# Patient Record
Sex: Female | Born: 1935 | Hispanic: No
Health system: Midwestern US, Academic
[De-identification: ages and names within clinical notes are randomized; demographics above are authoritative.]

---

## 2017-07-02 ENCOUNTER — Encounter: Admit: 2017-07-02 | Discharge: 2017-07-02 | Payer: MEDICARE

## 2017-07-02 NOTE — Telephone Encounter
Spoke with pt and she denies all symptoms to include dizziness, syncope,  "soa", and chest pain. States she does not even recall having a fast HR.  States she has been busy with her son who has had back problems and eye problems. Also taking care of Sister-in-law with an emergency.  Reviewed situation with Dr.Hannen who [place].Owens is on vacation.  Pt worked in to see Dr. Barry Dienes on 07/15/17 at 9:15 am. Attempted to call pt to inform her of the app and no answer.LM to call.

## 2017-07-02 NOTE — Telephone Encounter
-----   Message from Connye Burkitt, RN sent at 07/02/2017  1:26 PM CDT -----  Regarding: FW: remote from today      ----- Message -----  From: Gordy Savers  Sent: 07/02/2017   1:20 PM  To: Connye Burkitt, RN  Subject: remote from today                                2 AT/AF episodes, longest 53 seconds. 177 to 206 bpm avg atrial rates. 102 to 110 bpm avg V rates. Egms show AT Possible AFL.     Thanks,    Thurmond Butts

## 2017-07-03 ENCOUNTER — Ambulatory Visit: Admit: 2017-07-02 | Discharge: 2017-07-03 | Payer: MEDICARE

## 2017-07-03 DIAGNOSIS — I472 Ventricular tachycardia: ICD-10-CM

## 2017-07-03 DIAGNOSIS — Z95810 Presence of automatic (implantable) cardiac defibrillator: ICD-10-CM

## 2017-07-03 DIAGNOSIS — I255 Ischemic cardiomyopathy: Principal | ICD-10-CM

## 2017-07-05 ENCOUNTER — Encounter: Admit: 2017-07-05 | Discharge: 2017-07-05 | Payer: MEDICARE

## 2017-07-05 NOTE — Telephone Encounter
Pt returned call to Alona Bene re: OV on 16th and left preferred # for call back. Spoke to pt and confirmed 8/16 at 0915 will work for her. Order entered and sent msg to scheduler. No further needs identified at this time.

## 2017-07-15 ENCOUNTER — Ambulatory Visit: Admit: 2017-07-15 | Discharge: 2017-07-16 | Payer: MEDICARE

## 2017-07-15 ENCOUNTER — Encounter: Admit: 2017-07-15 | Discharge: 2017-07-15 | Payer: MEDICARE

## 2017-07-15 DIAGNOSIS — I10 Essential (primary) hypertension: ICD-10-CM

## 2017-07-15 DIAGNOSIS — E785 Hyperlipidemia, unspecified: Principal | ICD-10-CM

## 2017-07-15 DIAGNOSIS — I2109 ST elevation (STEMI) myocardial infarction involving other coronary artery of anterior wall: ICD-10-CM

## 2017-07-15 DIAGNOSIS — I255 Ischemic cardiomyopathy: ICD-10-CM

## 2017-07-15 DIAGNOSIS — I499 Cardiac arrhythmia, unspecified: ICD-10-CM

## 2017-07-15 DIAGNOSIS — I071 Rheumatic tricuspid insufficiency: ICD-10-CM

## 2017-07-15 DIAGNOSIS — I25119 Atherosclerotic heart disease of native coronary artery with unspecified angina pectoris: ICD-10-CM

## 2017-07-15 DIAGNOSIS — I2510 Atherosclerotic heart disease of native coronary artery without angina pectoris: Principal | ICD-10-CM

## 2017-07-15 DIAGNOSIS — Z95810 Presence of automatic (implantable) cardiac defibrillator: ICD-10-CM

## 2017-07-15 DIAGNOSIS — I5189 Other ill-defined heart diseases: ICD-10-CM

## 2017-07-15 DIAGNOSIS — I340 Nonrheumatic mitral (valve) insufficiency: ICD-10-CM

## 2017-07-15 NOTE — Assessment & Plan Note
Blood pressure looks fine on the current medical program.

## 2017-07-15 NOTE — Assessment & Plan Note
Her last ischemia evaluation was a stress test earlier this year showing a fixed anterior defect but no ischemia.  The ejection fraction was 44%.

## 2017-07-15 NOTE — Progress Notes
Date of Service: 07/15/2017    Grace Wright is a 81 y.o. female.       HPI     Treyonna was in the [place] office today for follow-up regarding coronary disease, ICD, and LV dysfunction.  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  She's been involved providing care for her son, JD, who has had a lot of back trouble.    She has done well from a cardiac standpoint and has had no problems with chest discomfort, breathlessness, or palpitations.  She denies any TIA or stroke symptoms.  She has had no peripheral edema or other manifestations of fluid retention.         Vitals:    07/15/17 0915 07/15/17 0931   BP: 126/82 120/74   Pulse: 78    Weight: 62.4 kg (137 lb 9.6 oz)    Height: 1.549 m (5' 1)      Body mass index is 26 kg/m???.     Past Medical History  Patient Active Problem List    Diagnosis Date Noted   ??? Carotid artery bruit 01/14/2017     02/09/17 Carotid duplex at [facility]: Mild carotid bulb plaque bilaterally extending of the prox bilateral internal carotid arteries. No hemodynamically significant stenosis.     ??? Automatic implantable cardioverter-defibrillator in situ 03/25/2010   ??? Coronary artery disease 09/03/2009     5/86 - Anterior MI.      1987 - RCA and LAD angioplasty.      7/99 - Exercise echo: EF 30%, non-ischemic.      7/00 - Gated bruce thallium: no active inducible ischemia.      7/01 - Exercise echo: non-ischemic.     ??? Ischemic cardiomyopathy 09/03/2009      7/99 - EF 30% per echo.       7/00 - EF 37% per thallium.       7/01 - EF 25% per echo     ??? Hyperlipidemia 09/03/2009     1997- Treatment with Zocor.       7/01 - Switched to Lipitor d/t prescription formulary.       2010 - Switched to Crestor for prescription formulary     ??? Hypertension 09/03/2009   ??? Mitral regurgitation 09/03/2009   ??? Tricuspid regurgitation 09/03/2009   ??? Diastolic dysfunction 09/03/2009     Mild     ??? Acute MI anterior wall first episode care [facility]) 09/04/1986 S/P PTCA LAD, S/P PTCA RCA           Review of Systems   Constitution: Positive for diaphoresis.   HENT: Negative.    Eyes: Negative.    Cardiovascular: Negative.    Respiratory: Negative.    Endocrine: Positive for cold intolerance.   Hematologic/Lymphatic: Negative.    Skin: Negative.    Musculoskeletal: Negative.    Gastrointestinal: Negative.    Genitourinary: Negative.    Neurological: Positive for light-headedness.   Psychiatric/Behavioral: Negative.    Allergic/Immunologic: Positive for environmental allergies.       Physical Exam    Physical Exam   General Appearance: no distress   Skin: warm, no ulcers or xanthomas   Digits and Nails: no cyanosis or clubbing   Eyes: conjunctivae and lids normal, pupils are equal and round   Teeth/Gums/Palate: dentition unremarkable, no lesions   Lips & Oral Mucosa: no pallor or cyanosis   Neck Veins: normal JVP , neck veins are not distended  Thyroid: no nodules, masses, tenderness or enlargement   Chest Inspection: chest is normal in appearance   Respiratory Effort: breathing comfortably, no respiratory distress   Auscultation/Percussion: lungs clear to auscultation, no rales or rhonchi, no wheezing   PMI: PMI not enlarged or displaced   Cardiac Rhythm: regular rhythm and normal rate   Cardiac Auscultation: S1, S2 normal, no rub, no gallop   Murmurs: no murmur   Peripheral Circulation: normal peripheral circulation   Carotid Arteries: normal carotid upstroke bilaterally, no bruits   Radial Arteries: normal symmetric radial pulses   Abdominal Aorta: no abdominal aortic bruit   Pedal Pulses: normal symmetric pedal pulses   Lower Extremity Edema: no lower extremity edema   Abdominal Exam: soft, non-tender, no masses, bowel sounds normal   Liver & Spleen: no organomegaly   Gait & Station: walks without assistance   Muscle Strength: normal muscle tone   Orientation: oriented to time, place and person   Affect & Mood: appropriate and sustained affect Language and Memory: patient responsive and seems to comprehend information   Neurologic Exam: neurological assessment grossly intact   Other: moves all extremities      Cardiovascular Studies    EKG:  Atrial-paced rhythm with LBBB.  Rate 78.    Problems Addressed Today  Encounter Diagnoses   Name Primary?   ??? Hyperlipidemia, unspecified hyperlipidemia type    ??? Ischemic cardiomyopathy    ??? Essential hypertension    ??? Coronary artery disease involving native coronary artery of native heart with angina pectoris (HCC)    ??? Automatic implantable cardioverter-defibrillator in situ        Assessment and Plan       Hyperlipidemia  Lab Results   Component Value Date    CHOL 167 12/25/2016    TRIG 240 (H) 12/25/2016    HDL 31 (L) 12/25/2016    LDL 74 12/25/2016    VLDL 48 (H) 12/25/2016    CHOLHDLC 5 12/25/2016      LDL treated to goal.    Ischemic cardiomyopathy  EF was 44% by RVG earlier this year.  No HF symptoms or physical findings.    Hypertension  Blood pressure looks fine on the current medical program.    Coronary artery disease  Her last ischemia evaluation was a stress test earlier this year showing a fixed anterior defect but no ischemia.  The ejection fraction was 44%.    Automatic implantable cardioverter-defibrillator in situ  Normal device function by remote transmission earlier this month.  She has always had some brief atrial arrhythmias detected but I do not think these are clinically significant.      Current Medications (including today's revisions)  ??? acetaminophen (TYLENOL EXTRA STRENGTH) 500 mg tablet Take 500 mg by mouth Every 6 Hours as needed for Pain.   ??? aspirin EC 81 mg tablet Take 81 mg by mouth Daily.   ??? atorvastatin (LIPITOR) 20 mg tablet Take 1 tablet by mouth daily.   ??? busPIRone (BUSPAR) 5 mg tablet Take 5 mg by mouth Twice Daily.   ??? carvedilol (COREG) 25 mg PO tablet Take 25 mg by mouth Twice Daily With Meals.   ??? latanoprost (XALATAN) 0.005 % ophthalmic solution Place 1 drop into or around eye(s) at bedtime daily.   ??? losartan-hydrochlorothiazide (HYZAAR) 100-12.5 mg per tablet Take 1 Tab by mouth daily.   ??? omeprazole DR(+) (PRILOSEC) 40 mg capsule Take 40 mg by mouth daily before breakfast.

## 2017-07-15 NOTE — Assessment & Plan Note
Normal device function by remote transmission earlier this month.  She has always had some brief atrial arrhythmias detected but I do not think these are clinically significant.

## 2017-07-15 NOTE — Assessment & Plan Note
Lab Results   Component Value Date    CHOL 167 12/25/2016    TRIG 240 (H) 12/25/2016    HDL 31 (L) 12/25/2016    LDL 74 12/25/2016    VLDL 48 (H) 12/25/2016    CHOLHDLC 5 12/25/2016      LDL treated to goal.

## 2017-07-15 NOTE — Assessment & Plan Note
EF was 44% by RVG earlier this year.  No HF symptoms or physical findings.

## 2017-10-01 ENCOUNTER — Ambulatory Visit: Admit: 2017-10-01 | Discharge: 2017-10-02 | Payer: MEDICARE

## 2017-10-02 DIAGNOSIS — Z95810 Presence of automatic (implantable) cardiac defibrillator: ICD-10-CM

## 2017-10-02 DIAGNOSIS — I255 Ischemic cardiomyopathy: Principal | ICD-10-CM

## 2017-11-01 LAB — BASIC METABOLIC PANEL
Lab: 1.3 — ABNORMAL HIGH (ref 0.57–1.11)
Lab: 106
Lab: 107
Lab: 140
Lab: 22 — ABNORMAL LOW (ref 23–31)
Lab: 34 — ABNORMAL HIGH (ref 9.8–20.1)
Lab: 4
Lab: 9.8

## 2017-12-23 ENCOUNTER — Encounter: Admit: 2017-12-23 | Discharge: 2017-12-23 | Payer: MEDICARE

## 2017-12-25 ENCOUNTER — Encounter: Admit: 2017-12-25 | Discharge: 2017-12-25 | Payer: MEDICARE

## 2017-12-25 LAB — COMPREHENSIVE METABOLIC PANEL
Lab: 0.9
Lab: 110
Lab: 137
Lab: 16
Lab: 18 — ABNORMAL HIGH (ref 0–14)
Lab: 33 — ABNORMAL HIGH (ref 9.8–20.1)
Lab: 61
Lab: 8.7 — ABNORMAL HIGH (ref 6.2–8.1)
Lab: 9.6

## 2017-12-25 LAB — CBC: Lab: 6.2

## 2017-12-25 LAB — TROPONIN-I

## 2017-12-25 LAB — MYOGLOBIN-CHEM: Lab: 38 — ABNORMAL LOW (ref 23–31)

## 2017-12-25 LAB — CREATINE KINASE-CPK: Lab: 50

## 2018-01-04 ENCOUNTER — Encounter: Admit: 2018-01-04 | Discharge: 2018-01-04 | Payer: MEDICARE

## 2018-01-06 ENCOUNTER — Encounter: Admit: 2018-01-06 | Discharge: 2018-01-06 | Payer: MEDICARE

## 2018-01-06 ENCOUNTER — Ambulatory Visit: Admit: 2018-01-06 | Discharge: 2018-01-07 | Payer: MEDICARE

## 2018-01-06 DIAGNOSIS — I2109 ST elevation (STEMI) myocardial infarction involving other coronary artery of anterior wall: ICD-10-CM

## 2018-01-06 DIAGNOSIS — Z95810 Presence of automatic (implantable) cardiac defibrillator: Principal | ICD-10-CM

## 2018-01-06 DIAGNOSIS — I255 Ischemic cardiomyopathy: ICD-10-CM

## 2018-01-06 DIAGNOSIS — I25119 Atherosclerotic heart disease of native coronary artery with unspecified angina pectoris: Principal | ICD-10-CM

## 2018-01-06 DIAGNOSIS — I10 Essential (primary) hypertension: ICD-10-CM

## 2018-01-06 DIAGNOSIS — I2510 Atherosclerotic heart disease of native coronary artery without angina pectoris: Principal | ICD-10-CM

## 2018-01-06 DIAGNOSIS — E785 Hyperlipidemia, unspecified: ICD-10-CM

## 2018-01-06 DIAGNOSIS — I340 Nonrheumatic mitral (valve) insufficiency: ICD-10-CM

## 2018-01-06 DIAGNOSIS — I5189 Other ill-defined heart diseases: ICD-10-CM

## 2018-01-06 DIAGNOSIS — I071 Rheumatic tricuspid insufficiency: ICD-10-CM

## 2018-01-06 DIAGNOSIS — I499 Cardiac arrhythmia, unspecified: ICD-10-CM

## 2018-01-06 DIAGNOSIS — R079 Chest pain, unspecified: Principal | ICD-10-CM

## 2018-01-06 LAB — BASIC METABOLIC PANEL
Lab: 104
Lab: 14
Lab: 25
Lab: 27 — ABNORMAL HIGH (ref 9.8–20.1)
Lab: 54
Lab: 9.9
Lab: 93

## 2018-01-06 LAB — CBC
Lab: 13
Lab: 31 — ABNORMAL LOW
Lab: 4.5
Lab: 7.2

## 2018-01-06 MED ORDER — ACETAMINOPHEN 325 MG PO TAB
650 mg | ORAL | 0 refills | Status: CN | PRN
Start: 2018-01-06 — End: ?

## 2018-01-06 MED ORDER — SODIUM CHLORIDE 0.9 % IV SOLP
250 mL | INTRAVENOUS | 0 refills | Status: CN | PRN
Start: 2018-01-06 — End: ?

## 2018-01-06 MED ORDER — ASPIRIN 325 MG PO TAB
325 mg | Freq: Once | ORAL | 0 refills | Status: CN
Start: 2018-01-06 — End: ?

## 2018-01-06 MED ORDER — SODIUM CHLORIDE 0.9 % IV SOLP
INTRAVENOUS | 0 refills | Status: CN
Start: 2018-01-06 — End: ?

## 2018-01-06 MED ORDER — LIDOCAINE (PF) 10 MG/ML (1 %) IJ SOLN
.1-2 mL | INTRAMUSCULAR | 0 refills | Status: CN | PRN
Start: 2018-01-06 — End: ?

## 2018-01-06 MED ORDER — ALUMINUM-MAGNESIUM HYDROXIDE 200-200 MG/5 ML PO SUSP
30 mL | ORAL | 0 refills | Status: CN | PRN
Start: 2018-01-06 — End: ?

## 2018-01-06 MED ORDER — TEMAZEPAM 15 MG PO CAP
15 mg | Freq: Every evening | ORAL | 0 refills | Status: CN | PRN
Start: 2018-01-06 — End: ?

## 2018-01-07 ENCOUNTER — Encounter: Admit: 2018-01-07 | Discharge: 2018-01-07 | Payer: MEDICARE

## 2018-01-07 ENCOUNTER — Encounter: Admit: 2018-01-07 | Discharge: 2018-01-08 | Payer: MEDICARE

## 2018-01-07 ENCOUNTER — Ambulatory Visit: Admit: 2018-01-07 | Discharge: 2018-01-07 | Payer: MEDICARE

## 2018-01-07 MED ORDER — CARVEDILOL 25 MG PO TAB
25 mg | Freq: Two times a day (BID) | ORAL | 0 refills | Status: DC
Start: 2018-01-07 — End: 2018-01-08
  Administered 2018-01-07 – 2018-01-08 (×2): 25 mg via ORAL

## 2018-01-07 MED ORDER — ALUMINUM-MAGNESIUM HYDROXIDE 200-200 MG/5 ML PO SUSP
30 mL | ORAL | 0 refills | Status: DC | PRN
Start: 2018-01-07 — End: 2018-01-08

## 2018-01-07 MED ORDER — DIPHENHYDRAMINE HCL 25 MG PO CAP
25 mg | ORAL | 0 refills | Status: DC | PRN
Start: 2018-01-07 — End: 2018-01-08

## 2018-01-07 MED ORDER — ASPIRIN 81 MG PO TBEC
81 mg | Freq: Every day | ORAL | 0 refills | Status: DC
Start: 2018-01-07 — End: 2018-01-08
  Administered 2018-01-08: 14:00:00 81 mg via ORAL

## 2018-01-07 MED ORDER — FAMOTIDINE 20 MG PO TAB
40 mg | Freq: Every day | ORAL | 0 refills | Status: DC
Start: 2018-01-07 — End: 2018-01-08
  Administered 2018-01-08: 14:00:00 40 mg via ORAL

## 2018-01-07 MED ORDER — ATORVASTATIN 20 MG PO TAB
20 mg | Freq: Every day | ORAL | 0 refills | Status: DC
Start: 2018-01-07 — End: 2018-01-07

## 2018-01-07 MED ORDER — LIDOCAINE (PF) 10 MG/ML (1 %) IJ SOLN
.1-2 mL | INTRAMUSCULAR | 0 refills | Status: DC | PRN
Start: 2018-01-07 — End: 2018-01-08

## 2018-01-07 MED ORDER — SODIUM CHLORIDE 0.9 % IV SOLP
INTRAVENOUS | 0 refills | Status: DC
Start: 2018-01-07 — End: 2018-01-08
  Administered 2018-01-07: 17:00:00 1000.000 mL via INTRAVENOUS

## 2018-01-07 MED ORDER — ASPIRIN 325 MG PO TAB
325 mg | Freq: Once | ORAL | 0 refills | Status: DC
Start: 2018-01-07 — End: 2018-01-07

## 2018-01-07 MED ORDER — ATORVASTATIN 40 MG PO TAB
40 mg | Freq: Every evening | ORAL | 0 refills | Status: DC
Start: 2018-01-07 — End: 2018-01-08
  Administered 2018-01-08: 03:00:00 40 mg via ORAL

## 2018-01-07 MED ORDER — TEMAZEPAM 15 MG PO CAP
15 mg | Freq: Every evening | ORAL | 0 refills | Status: DC | PRN
Start: 2018-01-07 — End: 2018-01-08

## 2018-01-07 MED ORDER — ACETAMINOPHEN 500 MG PO TAB
500 mg | ORAL | 0 refills | Status: DC | PRN
Start: 2018-01-07 — End: 2018-01-07

## 2018-01-07 MED ORDER — CLOPIDOGREL 75 MG PO TAB
75 mg | Freq: Every day | ORAL | 0 refills | Status: DC
Start: 2018-01-07 — End: 2018-01-08
  Administered 2018-01-08: 14:00:00 75 mg via ORAL

## 2018-01-07 MED ORDER — ONDANSETRON HCL (PF) 4 MG/2 ML IJ SOLN
4 mg | INTRAVENOUS | 0 refills | Status: DC | PRN
Start: 2018-01-07 — End: 2018-01-08

## 2018-01-07 MED ORDER — BUSPIRONE 5 MG PO TAB
5 mg | Freq: Two times a day (BID) | ORAL | 0 refills | Status: DC
Start: 2018-01-07 — End: 2018-01-08
  Administered 2018-01-08 (×2): 5 mg via ORAL

## 2018-01-07 MED ORDER — LATANOPROST 0.005 % OP DROP
1 [drp] | Freq: Every evening | OPHTHALMIC | 0 refills | Status: DC
Start: 2018-01-07 — End: 2018-01-08
  Administered 2018-01-08: 03:00:00 1 [drp] via OPHTHALMIC

## 2018-01-07 MED ORDER — CLOPIDOGREL 300 MG PO TAB
600 mg | Freq: Once | ORAL | 0 refills | Status: CP
Start: 2018-01-07 — End: ?

## 2018-01-07 MED ORDER — LOSARTAN-HYDROCHLOROTHIAZIDE 100-12.5 MG TAB
Freq: Every day | ORAL | 0 refills | Status: DC
Start: 2018-01-07 — End: 2018-01-08
  Administered 2018-01-07 – 2018-01-08 (×4): via ORAL

## 2018-01-07 MED ORDER — DIPHENHYDRAMINE HCL 50 MG/ML IJ SOLN
25 mg | INTRAVENOUS | 0 refills | Status: DC | PRN
Start: 2018-01-07 — End: 2018-01-08

## 2018-01-07 MED ORDER — ACETAMINOPHEN 325 MG PO TAB
650 mg | ORAL | 0 refills | Status: DC | PRN
Start: 2018-01-07 — End: 2018-01-08
  Administered 2018-01-08: 02:00:00 650 mg via ORAL

## 2018-01-07 MED ORDER — ADENOSINE IVPB
.56 mg/kg | Freq: Once | INTRAVENOUS | 0 refills | Status: CP
Start: 2018-01-07 — End: ?

## 2018-01-07 MED ORDER — ASPIRIN 81 MG PO TBEC
81 mg | Freq: Every day | ORAL | 0 refills | Status: DC
Start: 2018-01-07 — End: 2018-01-07

## 2018-01-07 MED ORDER — SODIUM CHLORIDE 0.9 % IV SOLP
250 mL | INTRAVENOUS | 0 refills | Status: DC | PRN
Start: 2018-01-07 — End: 2018-01-08

## 2018-01-08 DIAGNOSIS — R61 Generalized hyperhidrosis: ICD-10-CM

## 2018-01-08 DIAGNOSIS — I2510 Atherosclerotic heart disease of native coronary artery without angina pectoris: Principal | ICD-10-CM

## 2018-01-08 DIAGNOSIS — I255 Ischemic cardiomyopathy: Secondary | ICD-10-CM

## 2018-01-08 DIAGNOSIS — R42 Dizziness and giddiness: ICD-10-CM

## 2018-01-08 LAB — BASIC METABOLIC PANEL
Lab: 137 MMOL/L (ref 137–147)
Lab: 4 MMOL/L — ABNORMAL HIGH (ref 3.5–5.1)
Lab: 9 K/UL — ABNORMAL LOW (ref >60)

## 2018-01-08 LAB — LIPID PROFILE
Lab: 103 mg/dL — ABNORMAL HIGH (ref 65–99)
Lab: 132 mg/dL (ref <200)
Lab: 153 mg/dL — ABNORMAL HIGH (ref <150)
Lab: 29 mg/dL — ABNORMAL LOW (ref >40)
Lab: 31 mg/dL — ABNORMAL HIGH (ref 9.0–11.5)
Lab: 69 mg/dL — ABNORMAL HIGH (ref <100)

## 2018-01-08 MED ORDER — CLOPIDOGREL 75 MG PO TAB
75 mg | ORAL_TABLET | Freq: Every day | ORAL | 3 refills | 90.00000 days | Status: AC
Start: 2018-01-08 — End: 2018-10-25

## 2018-01-08 MED ORDER — ATORVASTATIN 40 MG PO TAB
40 mg | ORAL_TABLET | Freq: Every evening | ORAL | 3 refills | Status: AC
Start: 2018-01-08 — End: 2019-01-16

## 2018-01-10 ENCOUNTER — Encounter: Admit: 2018-01-10 | Discharge: 2018-01-10 | Payer: MEDICARE

## 2018-01-10 LAB — POC ACTIVATED CLOTTING TIME
Lab: 225 s
Lab: 320 s
Lab: 316 s

## 2018-01-19 ENCOUNTER — Ambulatory Visit: Admit: 2018-01-19 | Discharge: 2018-01-20 | Payer: MEDICARE

## 2018-01-20 DIAGNOSIS — I255 Ischemic cardiomyopathy: Principal | ICD-10-CM

## 2018-01-20 DIAGNOSIS — Z95810 Presence of automatic (implantable) cardiac defibrillator: Secondary | ICD-10-CM

## 2018-02-03 ENCOUNTER — Encounter: Admit: 2018-02-03 | Discharge: 2018-02-03 | Payer: MEDICARE

## 2018-02-03 ENCOUNTER — Ambulatory Visit: Admit: 2018-02-03 | Discharge: 2018-02-04 | Payer: MEDICARE

## 2018-02-03 DIAGNOSIS — Z95810 Presence of automatic (implantable) cardiac defibrillator: Principal | ICD-10-CM

## 2018-02-03 DIAGNOSIS — I071 Rheumatic tricuspid insufficiency: ICD-10-CM

## 2018-02-03 DIAGNOSIS — I10 Essential (primary) hypertension: ICD-10-CM

## 2018-02-03 DIAGNOSIS — I255 Ischemic cardiomyopathy: ICD-10-CM

## 2018-02-03 DIAGNOSIS — I2510 Atherosclerotic heart disease of native coronary artery without angina pectoris: Principal | ICD-10-CM

## 2018-02-03 DIAGNOSIS — E785 Hyperlipidemia, unspecified: ICD-10-CM

## 2018-02-03 DIAGNOSIS — I2109 ST elevation (STEMI) myocardial infarction involving other coronary artery of anterior wall: ICD-10-CM

## 2018-02-03 DIAGNOSIS — I5189 Other ill-defined heart diseases: ICD-10-CM

## 2018-02-03 DIAGNOSIS — I340 Nonrheumatic mitral (valve) insufficiency: ICD-10-CM

## 2018-02-03 DIAGNOSIS — I499 Cardiac arrhythmia, unspecified: ICD-10-CM

## 2018-02-03 MED ORDER — HYDROCHLOROTHIAZIDE 12.5 MG PO TAB
12.5 mg | ORAL_TABLET | Freq: Every morning | ORAL | 3 refills | 30.00000 days | Status: AC
Start: 2018-02-03 — End: 2019-01-17

## 2018-03-03 ENCOUNTER — Encounter: Admit: 2018-03-03 | Discharge: 2018-03-03 | Payer: MEDICARE

## 2018-03-03 DIAGNOSIS — I499 Cardiac arrhythmia, unspecified: ICD-10-CM

## 2018-03-03 DIAGNOSIS — I5189 Other ill-defined heart diseases: ICD-10-CM

## 2018-03-03 DIAGNOSIS — I255 Ischemic cardiomyopathy: ICD-10-CM

## 2018-03-03 DIAGNOSIS — I340 Nonrheumatic mitral (valve) insufficiency: ICD-10-CM

## 2018-03-03 DIAGNOSIS — I10 Essential (primary) hypertension: ICD-10-CM

## 2018-03-03 DIAGNOSIS — I2109 ST elevation (STEMI) myocardial infarction involving other coronary artery of anterior wall: ICD-10-CM

## 2018-03-03 DIAGNOSIS — E785 Hyperlipidemia, unspecified: ICD-10-CM

## 2018-03-03 DIAGNOSIS — I2510 Atherosclerotic heart disease of native coronary artery without angina pectoris: Principal | ICD-10-CM

## 2018-03-03 DIAGNOSIS — I071 Rheumatic tricuspid insufficiency: ICD-10-CM

## 2018-03-18 ENCOUNTER — Encounter: Admit: 2018-03-18 | Discharge: 2018-03-18 | Payer: MEDICARE

## 2018-03-21 ENCOUNTER — Encounter: Admit: 2018-03-21 | Discharge: 2018-03-21 | Payer: MEDICARE

## 2018-03-21 DIAGNOSIS — I255 Ischemic cardiomyopathy: Principal | ICD-10-CM

## 2018-03-21 DIAGNOSIS — Z4502 Encounter for adjustment and management of automatic implantable cardiac defibrillator: ICD-10-CM

## 2018-03-21 MED ORDER — CEFAZOLIN INJ 1GM IVP
1 g | INTRAVENOUS | 0 refills | Status: CN
Start: 2018-03-21 — End: ?

## 2018-03-21 MED ORDER — LIDOCAINE (PF) 10 MG/ML (1 %) IJ SOLN
.1-2 mL | INTRAMUSCULAR | 0 refills | Status: CN | PRN
Start: 2018-03-21 — End: ?

## 2018-03-21 MED ORDER — CEFAZOLIN INJ 1GM IVP
2 g | Freq: Once | INTRAVENOUS | 0 refills | Status: CN
Start: 2018-03-21 — End: ?

## 2018-03-22 ENCOUNTER — Encounter: Admit: 2018-03-22 | Discharge: 2018-03-22 | Payer: MEDICARE

## 2018-03-24 ENCOUNTER — Encounter: Admit: 2018-03-24 | Discharge: 2018-03-24 | Payer: MEDICARE

## 2018-03-24 ENCOUNTER — Ambulatory Visit: Admit: 2018-03-24 | Discharge: 2018-03-25 | Payer: MEDICARE

## 2018-03-24 DIAGNOSIS — I2109 ST elevation (STEMI) myocardial infarction involving other coronary artery of anterior wall: ICD-10-CM

## 2018-03-24 DIAGNOSIS — I499 Cardiac arrhythmia, unspecified: ICD-10-CM

## 2018-03-24 DIAGNOSIS — I10 Essential (primary) hypertension: ICD-10-CM

## 2018-03-24 DIAGNOSIS — E785 Hyperlipidemia, unspecified: ICD-10-CM

## 2018-03-24 DIAGNOSIS — I255 Ischemic cardiomyopathy: Principal | ICD-10-CM

## 2018-03-24 DIAGNOSIS — I2510 Atherosclerotic heart disease of native coronary artery without angina pectoris: Principal | ICD-10-CM

## 2018-03-24 DIAGNOSIS — E782 Mixed hyperlipidemia: ICD-10-CM

## 2018-03-24 DIAGNOSIS — Z95810 Presence of automatic (implantable) cardiac defibrillator: ICD-10-CM

## 2018-03-24 DIAGNOSIS — I340 Nonrheumatic mitral (valve) insufficiency: ICD-10-CM

## 2018-03-24 DIAGNOSIS — I071 Rheumatic tricuspid insufficiency: ICD-10-CM

## 2018-03-24 DIAGNOSIS — Z01810 Encounter for preprocedural cardiovascular examination: ICD-10-CM

## 2018-03-24 DIAGNOSIS — I5189 Other ill-defined heart diseases: ICD-10-CM

## 2018-03-24 LAB — CBC
Lab: 13 pg — ABNORMAL HIGH (ref 27.0–33.0)
Lab: 13 {cells}/uL (ref 200–950)
Lab: 235 {cells}/uL (ref 1500–7800)
Lab: 30 Thousand/uL — ABNORMAL HIGH (ref 140–400)
Lab: 31 fL — ABNORMAL LOW (ref 33.0–37.0)
Lab: 4.5 fL — ABNORMAL HIGH (ref 80.0–100.0)
Lab: 43 g/dL (ref 32.0–36.0)
Lab: 6.6 % (ref 35.0–45.0)
Lab: 95 % (ref 11.0–15.0)

## 2018-03-24 LAB — BASIC METABOLIC PANEL
Lab: 0.8
Lab: 102
Lab: 109 — ABNORMAL HIGH (ref 98–107)
Lab: 13
Lab: 142
Lab: 142 g/dL (ref 11.7–15.5)
Lab: 18
Lab: 3.9
Lab: 70
Lab: 9.5

## 2018-03-24 LAB — MAGNESIUM
Lab: 1.9
Lab: 1.9 10*6/uL (ref 3.80–5.10)

## 2018-03-28 ENCOUNTER — Encounter: Admit: 2018-03-28 | Discharge: 2018-03-28 | Payer: MEDICARE

## 2018-03-28 MED ORDER — MAGNESIUM HYDROXIDE 2,400 MG/10 ML PO SUSP
10 mL | ORAL | 0 refills | Status: CN | PRN
Start: 2018-03-28 — End: ?

## 2018-03-28 MED ORDER — ACETAMINOPHEN 325 MG PO TAB
650 mg | ORAL | 0 refills | Status: CN | PRN
Start: 2018-03-28 — End: ?

## 2018-03-31 ENCOUNTER — Encounter: Admit: 2018-03-31 | Discharge: 2018-03-31 | Payer: MEDICARE

## 2018-03-31 ENCOUNTER — Ambulatory Visit: Admit: 2018-03-31 | Discharge: 2018-03-31 | Payer: MEDICARE

## 2018-03-31 DIAGNOSIS — I255 Ischemic cardiomyopathy: Principal | ICD-10-CM

## 2018-03-31 DIAGNOSIS — I10 Essential (primary) hypertension: ICD-10-CM

## 2018-03-31 DIAGNOSIS — I071 Rheumatic tricuspid insufficiency: ICD-10-CM

## 2018-03-31 DIAGNOSIS — I2109 ST elevation (STEMI) myocardial infarction involving other coronary artery of anterior wall: ICD-10-CM

## 2018-03-31 DIAGNOSIS — I2510 Atherosclerotic heart disease of native coronary artery without angina pectoris: Principal | ICD-10-CM

## 2018-03-31 DIAGNOSIS — I340 Nonrheumatic mitral (valve) insufficiency: ICD-10-CM

## 2018-03-31 DIAGNOSIS — E785 Hyperlipidemia, unspecified: ICD-10-CM

## 2018-03-31 DIAGNOSIS — I5189 Other ill-defined heart diseases: ICD-10-CM

## 2018-03-31 DIAGNOSIS — Z4502 Encounter for adjustment and management of automatic implantable cardiac defibrillator: ICD-10-CM

## 2018-03-31 DIAGNOSIS — I499 Cardiac arrhythmia, unspecified: ICD-10-CM

## 2018-03-31 MED ORDER — HYDROCODONE-ACETAMINOPHEN 5-325 MG PO TAB
1 | ORAL | 0 refills | Status: DC | PRN
Start: 2018-03-31 — End: 2018-03-31

## 2018-03-31 MED ORDER — HYDROCODONE-ACETAMINOPHEN 5-300 MG PO TAB
1 | ORAL_TABLET | ORAL | 0 refills | 30.00000 days | Status: AC | PRN
Start: 2018-03-31 — End: 2019-04-13

## 2018-03-31 MED ORDER — CEPHALEXIN 500 MG PO CAP
500 mg | ORAL_CAPSULE | Freq: Three times a day (TID) | ORAL | 0 refills | Status: AC
Start: 2018-03-31 — End: ?

## 2018-03-31 MED ORDER — SODIUM CHLORIDE 0.9 % IV SOLP
500 mL | INTRAVENOUS | 0 refills | Status: DC
Start: 2018-03-31 — End: 2018-03-31
  Administered 2018-03-31: 12:00:00 500 mL via INTRAVENOUS

## 2018-03-31 MED ORDER — CEFAZOLIN INJ 1GM IVP
1 g | INTRAVENOUS | 0 refills | Status: DC
Start: 2018-03-31 — End: 2018-03-31

## 2018-03-31 MED ORDER — ACETAMINOPHEN 325 MG PO TAB
325-650 mg | ORAL | 0 refills | Status: DC | PRN
Start: 2018-03-31 — End: 2018-03-31

## 2018-03-31 MED ORDER — ACETAMINOPHEN 325 MG PO TAB
650 mg | ORAL | 0 refills | Status: DC | PRN
Start: 2018-03-31 — End: 2018-03-31

## 2018-03-31 MED ORDER — LIDOCAINE (PF) 10 MG/ML (1 %) IJ SOLN
.1-2 mL | INTRAMUSCULAR | 0 refills | Status: DC | PRN
Start: 2018-03-31 — End: 2018-03-31

## 2018-03-31 MED ORDER — MAGNESIUM HYDROXIDE 2,400 MG/10 ML PO SUSP
10 mL | ORAL | 0 refills | Status: DC | PRN
Start: 2018-03-31 — End: 2018-03-31

## 2018-03-31 MED ORDER — CEFAZOLIN INJ 1GM IVP
2 g | Freq: Once | INTRAVENOUS | 0 refills | Status: CP
Start: 2018-03-31 — End: ?

## 2018-04-01 ENCOUNTER — Encounter: Admit: 2018-04-01 | Discharge: 2018-04-01 | Payer: MEDICARE

## 2018-04-20 ENCOUNTER — Ambulatory Visit: Admit: 2018-04-20 | Discharge: 2018-04-21 | Payer: MEDICARE

## 2018-04-20 DIAGNOSIS — Z95810 Presence of automatic (implantable) cardiac defibrillator: Principal | ICD-10-CM

## 2018-04-21 DIAGNOSIS — I255 Ischemic cardiomyopathy: ICD-10-CM

## 2018-07-11 LAB — CBC
Lab: 13
Lab: 41
Lab: 6.5

## 2018-07-11 LAB — COMPREHENSIVE METABOLIC PANEL
Lab: 144
Lab: 28

## 2018-07-20 ENCOUNTER — Encounter: Admit: 2018-07-20 | Discharge: 2018-07-20 | Payer: MEDICARE

## 2018-07-21 ENCOUNTER — Ambulatory Visit: Admit: 2018-07-20 | Discharge: 2018-07-21 | Payer: MEDICARE

## 2018-07-21 DIAGNOSIS — Z95810 Presence of automatic (implantable) cardiac defibrillator: ICD-10-CM

## 2018-07-21 DIAGNOSIS — I255 Ischemic cardiomyopathy: Principal | ICD-10-CM

## 2018-08-31 ENCOUNTER — Encounter: Admit: 2018-08-31 | Discharge: 2018-08-31 | Payer: MEDICARE

## 2018-10-11 ENCOUNTER — Encounter: Admit: 2018-10-11 | Discharge: 2018-10-11 | Payer: MEDICARE

## 2018-10-11 DIAGNOSIS — I255 Ischemic cardiomyopathy: Principal | ICD-10-CM

## 2018-10-11 DIAGNOSIS — I10 Essential (primary) hypertension: ICD-10-CM

## 2018-10-11 DIAGNOSIS — I2510 Atherosclerotic heart disease of native coronary artery without angina pectoris: ICD-10-CM

## 2018-10-19 ENCOUNTER — Ambulatory Visit: Admit: 2018-10-19 | Discharge: 2018-10-19 | Payer: MEDICARE

## 2018-10-19 DIAGNOSIS — I428 Other cardiomyopathies: ICD-10-CM

## 2018-10-19 DIAGNOSIS — Z95810 Presence of automatic (implantable) cardiac defibrillator: ICD-10-CM

## 2018-10-19 DIAGNOSIS — I255 Ischemic cardiomyopathy: Principal | ICD-10-CM

## 2018-10-25 ENCOUNTER — Ambulatory Visit: Admit: 2018-10-25 | Discharge: 2018-10-26 | Payer: MEDICARE

## 2018-10-25 ENCOUNTER — Encounter: Admit: 2018-10-25 | Discharge: 2018-10-25 | Payer: MEDICARE

## 2018-10-25 DIAGNOSIS — I10 Essential (primary) hypertension: ICD-10-CM

## 2018-10-25 DIAGNOSIS — I2510 Atherosclerotic heart disease of native coronary artery without angina pectoris: Principal | ICD-10-CM

## 2018-10-25 DIAGNOSIS — E785 Hyperlipidemia, unspecified: ICD-10-CM

## 2018-10-25 DIAGNOSIS — I2109 ST elevation (STEMI) myocardial infarction involving other coronary artery of anterior wall: ICD-10-CM

## 2018-10-25 DIAGNOSIS — I5189 Other ill-defined heart diseases: ICD-10-CM

## 2018-10-25 DIAGNOSIS — I255 Ischemic cardiomyopathy: ICD-10-CM

## 2018-10-25 DIAGNOSIS — I340 Nonrheumatic mitral (valve) insufficiency: ICD-10-CM

## 2018-10-25 DIAGNOSIS — I071 Rheumatic tricuspid insufficiency: ICD-10-CM

## 2018-10-25 DIAGNOSIS — E782 Mixed hyperlipidemia: ICD-10-CM

## 2018-10-25 DIAGNOSIS — I499 Cardiac arrhythmia, unspecified: ICD-10-CM

## 2018-10-25 MED ORDER — CLOPIDOGREL 75 MG PO TAB
75 mg | ORAL_TABLET | Freq: Every day | ORAL | 3 refills | 90.00000 days | Status: AC
Start: 2018-10-25 — End: 2018-12-20

## 2018-12-20 ENCOUNTER — Encounter: Admit: 2018-12-20 | Discharge: 2018-12-20 | Payer: MEDICARE

## 2018-12-20 DIAGNOSIS — I2510 Atherosclerotic heart disease of native coronary artery without angina pectoris: Secondary | ICD-10-CM

## 2018-12-20 MED ORDER — CLOPIDOGREL 75 MG PO TAB
ORAL_TABLET | Freq: Every day | ORAL | 3 refills | 90.00000 days | Status: AC
Start: 2018-12-20 — End: 2019-01-19

## 2018-12-27 LAB — COMPREHENSIVE METABOLIC PANEL
Lab: 0.7
Lab: 1.1 mL/min — ABNORMAL HIGH (ref 0.57–1.11)
Lab: 104 MMOL/L (ref 21–30)
Lab: 11
Lab: 115 mL/min — ABNORMAL HIGH (ref 70–105)
Lab: 138 U/L (ref 25–110)
Lab: 14
Lab: 16
Lab: 24 U/L (ref 7–56)
Lab: 3.7
Lab: 30 10*3/uL — ABNORMAL HIGH (ref 9.8–20.1)
Lab: 4.3 U/L (ref 7–40)
Lab: 48 — ABNORMAL LOW (ref >59)
Lab: 7.5 10*3/uL (ref 0–0.20)
Lab: 76
Lab: 9.5 10*3/uL (ref 0–0.45)

## 2018-12-30 LAB — BASIC METABOLIC PANEL
Lab: 107
Lab: 13
Lab: 138
Lab: 3.7
Lab: 75

## 2019-01-03 ENCOUNTER — Encounter: Admit: 2019-01-03 | Discharge: 2019-01-03 | Payer: MEDICARE

## 2019-01-10 ENCOUNTER — Ambulatory Visit: Admit: 2019-01-10 | Discharge: 2019-01-11 | Payer: MEDICARE

## 2019-01-10 DIAGNOSIS — Z95810 Presence of automatic (implantable) cardiac defibrillator: Principal | ICD-10-CM

## 2019-01-16 ENCOUNTER — Encounter: Admit: 2019-01-16 | Discharge: 2019-01-16 | Payer: MEDICARE

## 2019-01-16 DIAGNOSIS — I2510 Atherosclerotic heart disease of native coronary artery without angina pectoris: Principal | ICD-10-CM

## 2019-01-16 MED ORDER — ATORVASTATIN 40 MG PO TAB
ORAL_TABLET | 2 refills | Status: AC
Start: 2019-01-16 — End: 2020-01-01

## 2019-01-17 ENCOUNTER — Encounter: Admit: 2019-01-17 | Discharge: 2019-01-17 | Payer: MEDICARE

## 2019-01-17 MED ORDER — HYDROCHLOROTHIAZIDE 12.5 MG PO TAB
12.5 mg | ORAL_TABLET | Freq: Every morning | ORAL | 1 refills | 30.00000 days | Status: AC
Start: 2019-01-17 — End: 2019-07-18

## 2019-01-19 ENCOUNTER — Ambulatory Visit: Admit: 2019-01-19 | Discharge: 2019-01-19 | Payer: MEDICARE

## 2019-01-19 ENCOUNTER — Encounter: Admit: 2019-01-19 | Discharge: 2019-01-19 | Payer: MEDICARE

## 2019-01-19 DIAGNOSIS — Z95810 Presence of automatic (implantable) cardiac defibrillator: ICD-10-CM

## 2019-01-19 DIAGNOSIS — I255 Ischemic cardiomyopathy: Principal | ICD-10-CM

## 2019-04-12 ENCOUNTER — Encounter: Admit: 2019-04-12 | Discharge: 2019-04-12 | Payer: MEDICARE

## 2019-04-13 ENCOUNTER — Ambulatory Visit: Admit: 2019-04-13 | Discharge: 2019-04-14 | Payer: MEDICARE

## 2019-04-13 ENCOUNTER — Encounter: Admit: 2019-04-13 | Discharge: 2019-04-13 | Payer: MEDICARE

## 2019-04-13 DIAGNOSIS — E785 Hyperlipidemia, unspecified: ICD-10-CM

## 2019-04-13 DIAGNOSIS — I255 Ischemic cardiomyopathy: Principal | ICD-10-CM

## 2019-04-13 DIAGNOSIS — I5189 Other ill-defined heart diseases: ICD-10-CM

## 2019-04-13 DIAGNOSIS — I2109 ST elevation (STEMI) myocardial infarction involving other coronary artery of anterior wall: ICD-10-CM

## 2019-04-13 DIAGNOSIS — I2510 Atherosclerotic heart disease of native coronary artery without angina pectoris: Principal | ICD-10-CM

## 2019-04-13 DIAGNOSIS — I499 Cardiac arrhythmia, unspecified: ICD-10-CM

## 2019-04-13 DIAGNOSIS — I340 Nonrheumatic mitral (valve) insufficiency: ICD-10-CM

## 2019-04-13 DIAGNOSIS — I071 Rheumatic tricuspid insufficiency: ICD-10-CM

## 2019-04-13 DIAGNOSIS — I10 Essential (primary) hypertension: ICD-10-CM

## 2019-04-13 DIAGNOSIS — E782 Mixed hyperlipidemia: ICD-10-CM

## 2019-04-13 NOTE — Assessment & Plan Note
Class I heart failure symptoms.  Stable volume status.

## 2019-04-13 NOTE — Progress Notes
Date of Service: 04/13/2019    Grace Wright is a 83 y.o. female.       HPI     Marielys was in the [place] office today for follow-up regarding ischemic cardiomyopathy and ICD.  She was hospitalized in January for an episode of pneumonia but had no cardiac complications.  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    She pretty much feels like she is back to normal now and she is having no problems with palpitations, lightheadedness, or breathlessness.  She denies any chest discomfort.  She has had no TIA or stroke symptoms.         Vitals:    04/13/19 1245 04/13/19 1251   BP: 130/86 128/80   BP Source: Arm, Left Upper Arm, Right Upper   Pulse: 78    Temp: 36.8 ???C (98.3 ???F)    SpO2: 98%    Weight: 58.5 kg (129 lb)    Height: 1.575 m (5' 2)    PainSc: Zero      Body mass index is 23.59 kg/m???.     Past Medical History  Patient Active Problem List    Diagnosis Date Noted   ??? Preop cardiovascular exam 03/24/2018   ??? Carotid artery bruit 01/14/2017     02/09/17 Carotid duplex at [facility]: Mild carotid bulb plaque bilaterally extending of the prox bilateral internal carotid arteries. No hemodynamically significant stenosis.     ??? Automatic implantable cardioverter-defibrillator in situ 03/25/2010   ??? Coronary artery disease 09/03/2009     5/86 - Anterior MI.      1987 - RCA and LAD angioplasty.      7/99 - Exercise echo: EF 30%, non-ischemic.      7/00 - Gated bruce thallium: no active inducible ischemia.      7/01 - Exercise echo: non-ischemic.  01/07/2018 cath - DES x 1 to LAD      ??? Ischemic cardiomyopathy 09/03/2009      7/99 - EF 30% per echo.       7/00 - EF 37% per thallium.       7/01 - EF 25% per echo     ??? Hyperlipidemia 09/03/2009     1997- Treatment with Zocor.       7/01 - Switched to Lipitor d/t prescription formulary.       2010 - Switched to Crestor for prescription formulary     ??? Hypertension 09/03/2009

## 2019-04-20 ENCOUNTER — Ambulatory Visit: Admit: 2019-04-20 | Discharge: 2019-04-21 | Payer: MEDICARE

## 2019-04-21 DIAGNOSIS — I255 Ischemic cardiomyopathy: Principal | ICD-10-CM

## 2019-04-21 DIAGNOSIS — Z95810 Presence of automatic (implantable) cardiac defibrillator: ICD-10-CM

## 2019-04-26 ENCOUNTER — Encounter: Admit: 2019-04-26 | Discharge: 2019-04-26 | Payer: MEDICARE

## 2019-04-26 DIAGNOSIS — Z95810 Presence of automatic (implantable) cardiac defibrillator: Principal | ICD-10-CM

## 2019-05-04 ENCOUNTER — Encounter: Admit: 2019-05-04 | Discharge: 2019-05-04

## 2019-05-04 DIAGNOSIS — E782 Mixed hyperlipidemia: Secondary | ICD-10-CM

## 2019-05-04 LAB — LIPID PROFILE
Lab: 147
Lab: 175 — ABNORMAL HIGH (ref <150)
Lab: 35
Lab: 80

## 2019-05-04 NOTE — Telephone Encounter
-----   Message from Vanice Sarah, MD sent at 05/04/2019 10:50 AM CDT -----  Looks OK, I wouldn't change anything at this point.    Cc:  Dr. Suzanna Obey

## 2019-05-04 NOTE — Telephone Encounter
Results and recommendations called to patient lmom requested call back if questions

## 2019-05-04 NOTE — Telephone Encounter
-----   Message from Steven D Owens, MD sent at 05/04/2019 10:50 AM CDT -----  Looks OK, I wouldn't change anything at this point.    Cc:  Dr. Groth

## 2019-07-18 ENCOUNTER — Encounter: Admit: 2019-07-18 | Discharge: 2019-07-18 | Payer: MEDICARE

## 2019-07-18 MED ORDER — HYDROCHLOROTHIAZIDE 12.5 MG PO TAB
12.5 mg | ORAL_TABLET | Freq: Every morning | ORAL | 3 refills | 30.00000 days | Status: DC
Start: 2019-07-18 — End: 2020-07-15

## 2019-07-21 ENCOUNTER — Encounter: Admit: 2019-07-21 | Discharge: 2019-07-21

## 2019-07-21 DIAGNOSIS — I472 Ventricular tachycardia: Principal | ICD-10-CM

## 2019-07-21 DIAGNOSIS — I255 Ischemic cardiomyopathy: Secondary | ICD-10-CM

## 2019-07-22 ENCOUNTER — Ambulatory Visit: Admit: 2019-07-21 | Discharge: 2019-07-22

## 2019-07-22 DIAGNOSIS — Z95810 Presence of automatic (implantable) cardiac defibrillator: Secondary | ICD-10-CM

## 2019-12-07 ENCOUNTER — Encounter: Admit: 2019-12-07 | Discharge: 2019-12-07 | Payer: MEDICARE

## 2019-12-14 ENCOUNTER — Encounter: Admit: 2019-12-14 | Discharge: 2019-12-14 | Payer: MEDICARE | Primary: Family

## 2019-12-14 DIAGNOSIS — I2510 Atherosclerotic heart disease of native coronary artery without angina pectoris: Secondary | ICD-10-CM

## 2019-12-14 DIAGNOSIS — I5189 Other ill-defined heart diseases: Secondary | ICD-10-CM

## 2019-12-14 DIAGNOSIS — I10 Essential (primary) hypertension: Secondary | ICD-10-CM

## 2019-12-14 DIAGNOSIS — I071 Rheumatic tricuspid insufficiency: Secondary | ICD-10-CM

## 2019-12-14 DIAGNOSIS — I2109 ST elevation (STEMI) myocardial infarction involving other coronary artery of anterior wall: Secondary | ICD-10-CM

## 2019-12-14 DIAGNOSIS — I255 Ischemic cardiomyopathy: Secondary | ICD-10-CM

## 2019-12-14 DIAGNOSIS — Z95810 Presence of automatic (implantable) cardiac defibrillator: Secondary | ICD-10-CM

## 2019-12-14 DIAGNOSIS — E782 Mixed hyperlipidemia: Secondary | ICD-10-CM

## 2019-12-14 DIAGNOSIS — E785 Hyperlipidemia, unspecified: Secondary | ICD-10-CM

## 2019-12-14 DIAGNOSIS — I340 Nonrheumatic mitral (valve) insufficiency: Secondary | ICD-10-CM

## 2019-12-14 DIAGNOSIS — I499 Cardiac arrhythmia, unspecified: Secondary | ICD-10-CM

## 2019-12-14 NOTE — Progress Notes
Date of Service: 12/14/2019    Grace Wright is a 84 y.o. female.       HPI     Grace Wright was in the [place] clinic today for follow-up regarding heart failure, coronary disease, and ICD.  She has been home and completely retired for the past year.  She does a lot of cooking still for family and friends and seems to be getting along just fine.  She denies any problems with chest discomfort or breathlessness.  She has had no fluid retention she denies any palpitations or lightheadedness.         Vitals:    12/14/19 1013 12/14/19 1022   BP: 132/80 128/80   BP Source: Arm, Left Upper Arm, Right Upper   Patient Position: Sitting Sitting   Pulse: 74    Temp: 36.6 ?C (97.9 ?F)    TempSrc: Oral    SpO2: 97%    Weight: 59 kg (130 lb)    Height: 1.575 m (5' 2)    PainSc: Zero      Body mass index is 23.78 kg/m?Marland Kitchen     Past Medical History  Patient Active Problem List    Diagnosis Date Noted   ? Preop cardiovascular exam 03/24/2018   ? Carotid artery bruit 01/14/2017     02/09/17 Carotid duplex at [facility]: Mild carotid bulb plaque bilaterally extending of the prox bilateral internal carotid arteries. No hemodynamically significant stenosis.     ? Automatic implantable cardioverter-defibrillator in situ 03/25/2010   ? Coronary artery disease 09/03/2009     5/86 - Anterior MI.      1987 - RCA and LAD angioplasty.      7/99 - Exercise echo: EF 30%, non-ischemic.      7/00 - Gated bruce thallium: no active inducible ischemia.      7/01 - Exercise echo: non-ischemic.  01/07/2018 cath - DES x 1 to LAD      ? Ischemic cardiomyopathy 09/03/2009      7/99 - EF 30% per echo.       7/00 - EF 37% per thallium.       7/01 - EF 25% per echo     ? Hyperlipidemia 09/03/2009     1997- Treatment with Zocor.       7/01 - Switched to Lipitor d/t prescription formulary.       2010 - Switched to Crestor for prescription formulary     ? Hypertension 09/03/2009   ? Mitral regurgitation 09/03/2009   ? Tricuspid regurgitation 09/03/2009 ? Diastolic dysfunction 09/03/2009     Mild     ? Acute MI anterior wall first episode care [facility]) 09/04/1986     S/P PTCA LAD, S/P PTCA RCA           Review of Systems   Constitution: Negative.   HENT: Negative.    Eyes: Negative.    Cardiovascular: Positive for leg swelling (left).   Respiratory: Negative.    Endocrine: Negative.    Hematologic/Lymphatic: Negative.    Skin: Negative.    Musculoskeletal: Negative.    Gastrointestinal: Negative.    Genitourinary: Negative.    Neurological: Negative.    Psychiatric/Behavioral: Negative.    Allergic/Immunologic: Negative.        Physical Exam    Physical Exam   General Appearance: no distress   Skin: warm, no ulcers or xanthomas   Digits and Nails: no cyanosis or clubbing   Eyes: conjunctivae and lids normal, pupils are equal and  round   Teeth/Gums/Palate: dentition unremarkable, no lesions   Lips & Oral Mucosa: no pallor or cyanosis   Neck Veins: normal JVP , neck veins are not distended   Thyroid: no nodules, masses, tenderness or enlargement   Chest Inspection: chest is normal in appearance   Respiratory Effort: breathing comfortably, no respiratory distress   Auscultation/Percussion: lungs clear to auscultation, no rales or rhonchi, no wheezing   PMI: PMI not enlarged or displaced   Cardiac Rhythm: regular rhythm and normal rate   Cardiac Auscultation: S1, S2 normal, no rub, no gallop   Murmurs: no murmur   Peripheral Circulation: normal peripheral circulation   Carotid Arteries: normal carotid upstroke bilaterally, no bruits   Radial Arteries: normal symmetric radial pulses   Abdominal Aorta: no abdominal aortic bruit   Pedal Pulses: normal symmetric pedal pulses   Lower Extremity Edema: no lower extremity edema   Abdominal Exam: soft, non-tender, no masses, bowel sounds normal   Liver & Spleen: no organomegaly   Gait & Station: walks without assistance   Muscle Strength: normal muscle tone   Orientation: oriented to time, place and person Affect & Mood: appropriate and sustained affect   Language and Memory: patient responsive and seems to comprehend information   Neurologic Exam: neurological assessment grossly intact   Other: moves all extremities      Problems Addressed Today  Encounter Diagnoses   Name Primary?   ? Coronary artery disease involving native coronary artery of native heart without angina pectoris    ? Ischemic cardiomyopathy    ? Mixed hyperlipidemia    ? Essential hypertension    ? Automatic implantable cardioverter-defibrillator in situ        Assessment and Plan       Coronary artery disease  No angina or other symptoms to suggest progression of coronary disease.  She had ACS presentation in early 2019 and underwent LAD stent at that time.  She has no ischemia assessment since then.    Ischemic cardiomyopathy  Her ejection fraction is stable in the range of 25%.  She does not have any manifestations of heart failure currently.    Hyperlipidemia  Lab Results   Component Value Date    CHOL 141 11/17/2019    TRIG 211 (H) 11/17/2019    HDL 34 (L) 11/17/2019    LDL 65 11/17/2019    VLDL 35 05/03/2019    NONHDLCHOL 103 01/08/2018    CHOLHDLC 4 11/17/2019      LDL treated to goal.    Hypertension  Blood pressure looks fine on the current medical program.    Automatic implantable cardioverter-defibrillator in situ  She had a normal remote transmission in late November.      Current Medications (including today's revisions)  ? acetaminophen (TYLENOL EXTRA STRENGTH) 500 mg tablet Take 500 mg by mouth Every 6 Hours as needed for Pain.   ? aspirin EC 81 mg tablet Take 81 mg by mouth Daily.   ? atorvastatin (LIPITOR) 40 mg tablet 1T PO QHS   ? busPIRone (BUSPAR) 15 mg tablet Take 0.5 tablets by mouth twice daily.   ? carvedilol (COREG) 25 mg PO tablet Take 25 mg by mouth Twice Daily With Meals.   ? hydroCHLOROthiazide (HYDRODIURIL) 12.5 mg tablet Take one tablet by mouth every morning. ? latanoprost (XALATAN) 0.005 % ophthalmic solution Place 1 drop into or around eye(s) at bedtime daily.   ? nitroglycerin (NITROSTAT) 0.4 mg tablet Place 1 tablet under tongue every 5  minutes as needed.   ? pantoprazole DR (PROTONIX) 40 mg tablet Take 1 tablet by mouth twice daily.   ? traZODone (DESYREL) 50 mg tablet Take 1 tablet by mouth at bedtime daily.     Total time spent on today's office visit was 30 minutes.  This includes face-to-face in person visit with patient as well as nonface-to-face time including review of the EMR, outside records, labs, radiologic studies, echocardiogram & other cardiovascular studies, formation of treatment plan, after visit summary, future disposition, and lastly on documentation.

## 2019-12-14 NOTE — Assessment & Plan Note
No angina or other symptoms to suggest progression of coronary disease.  She had ACS presentation in early 2019 and underwent LAD stent at that time.  She has no ischemia assessment since then.

## 2019-12-14 NOTE — Assessment & Plan Note
Lab Results   Component Value Date    CHOL 141 11/17/2019    TRIG 211 (H) 11/17/2019    HDL 34 (L) 11/17/2019    LDL 65 11/17/2019    VLDL 35 05/03/2019    NONHDLCHOL 103 01/08/2018    CHOLHDLC 4 11/17/2019      LDL treated to goal.

## 2019-12-14 NOTE — Assessment & Plan Note
She had a normal remote transmission in late November.

## 2019-12-14 NOTE — Assessment & Plan Note
Her ejection fraction is stable in the range of 25%.  She does not have any manifestations of heart failure currently.

## 2019-12-14 NOTE — Assessment & Plan Note
Blood pressure looks fine on the current medical program.

## 2020-01-01 ENCOUNTER — Encounter: Admit: 2020-01-01 | Discharge: 2020-01-01 | Payer: MEDICARE | Primary: Family

## 2020-01-01 DIAGNOSIS — I2510 Atherosclerotic heart disease of native coronary artery without angina pectoris: Secondary | ICD-10-CM

## 2020-01-01 MED ORDER — ATORVASTATIN 40 MG PO TAB
ORAL_TABLET | Freq: Every evening | 3 refills | Status: AC
Start: 2020-01-01 — End: ?

## 2020-01-23 ENCOUNTER — Encounter: Admit: 2020-01-23 | Discharge: 2020-01-23 | Payer: MEDICARE | Primary: Family

## 2020-01-23 ENCOUNTER — Ambulatory Visit: Admit: 2020-01-23 | Discharge: 2020-01-23 | Payer: MEDICARE | Primary: Family

## 2020-01-23 DIAGNOSIS — Z95810 Presence of automatic (implantable) cardiac defibrillator: Secondary | ICD-10-CM

## 2020-03-07 ENCOUNTER — Encounter: Admit: 2020-03-07 | Discharge: 2020-03-07 | Payer: MEDICARE | Primary: Family

## 2020-06-18 ENCOUNTER — Encounter: Admit: 2020-06-18 | Discharge: 2020-06-18 | Payer: MEDICARE | Primary: Family

## 2020-06-18 NOTE — Telephone Encounter
Patient was scheduled for a [employer] Carelink on 05/29/20 that has not been received.     Patient was instructed to send a manual transmission. Instructed if the transmitter does not appear to be working properly, they need to contact the device company directly. Patient was provided with that contact number. Requested the patient send [name] a MyChart message or contact our device nurses at [phone] to let us know after they have sent their transmission. Called Preferred telephone number spoke with Willie. Patient verbalized understanding.    CDJ

## 2020-07-07 ENCOUNTER — Encounter: Admit: 2020-07-07 | Discharge: 2020-07-07 | Payer: MEDICARE | Primary: Family

## 2020-07-09 ENCOUNTER — Encounter: Admit: 2020-07-09 | Discharge: 2020-07-09 | Payer: MEDICARE | Primary: Family

## 2020-07-09 DIAGNOSIS — I2510 Atherosclerotic heart disease of native coronary artery without angina pectoris: Secondary | ICD-10-CM

## 2020-07-09 DIAGNOSIS — E782 Mixed hyperlipidemia: Secondary | ICD-10-CM

## 2020-07-09 DIAGNOSIS — I5189 Other ill-defined heart diseases: Secondary | ICD-10-CM

## 2020-07-09 DIAGNOSIS — Z95810 Presence of automatic (implantable) cardiac defibrillator: Secondary | ICD-10-CM

## 2020-07-09 DIAGNOSIS — E785 Hyperlipidemia, unspecified: Secondary | ICD-10-CM

## 2020-07-09 DIAGNOSIS — I10 Essential (primary) hypertension: Secondary | ICD-10-CM

## 2020-07-09 DIAGNOSIS — I340 Nonrheumatic mitral (valve) insufficiency: Secondary | ICD-10-CM

## 2020-07-09 DIAGNOSIS — I499 Cardiac arrhythmia, unspecified: Secondary | ICD-10-CM

## 2020-07-09 DIAGNOSIS — I255 Ischemic cardiomyopathy: Secondary | ICD-10-CM

## 2020-07-09 DIAGNOSIS — I071 Rheumatic tricuspid insufficiency: Secondary | ICD-10-CM

## 2020-07-09 DIAGNOSIS — I2109 ST elevation (STEMI) myocardial infarction involving other coronary artery of anterior wall: Secondary | ICD-10-CM

## 2020-07-09 NOTE — Assessment & Plan Note
She had a device check a few weeks ago.  Her device functions normally.  She does have runs of nonsustained VT but none of these are symptomatic and she is required no treatments from her device.

## 2020-07-09 NOTE — Assessment & Plan Note
Goal BP is 130/80 or less, average.  She'll borrow her son's BP monitor and check a few times.  We'll call her in a week or so.

## 2020-07-09 NOTE — Assessment & Plan Note
Lab Results   Component Value Date    CHOL 141 11/17/2019    TRIG 211 (H) 11/17/2019    HDL 34 (L) 11/17/2019    LDL 65 11/17/2019    VLDL 35 05/03/2019    NONHDLCHOL 103 01/08/2018    CHOLHDLC 4 11/17/2019      LDL treated to goal.

## 2020-07-09 NOTE — Assessment & Plan Note
Her last coronary intervention was in February, 2019.  She has no symptoms and I have not recommended surveillance stress testing at this point.

## 2020-07-09 NOTE — Patient Instructions
Borrow JD's BP monitor.  Goal is 130/80 or less, on average  We'll call in a week or 10 days

## 2020-07-09 NOTE — Progress Notes
Date of Service: 07/09/2020    Grace Wright is a 84 y.o. female.       HPI     Grace Wright was in the [place] clinic today for follow-up regarding coronary disease, heart failure, and ICD.  She is done well over the summer months and has been going regularly to a friend's private swimming pool for exercise.  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  She is good with the decision and says that while she misses the people she used to work with she can always go over and visit with them from time to time.    She does not have a blood pressure monitor.  She thinks that her blood pressure was a little elevated last time she was in to see her primary physician.  She will borrow 1 from her son and will touch base with her in about 10 days.    She is not having any palpitations or lightheadedness.  She has had no chest discomfort, breathlessness, or fluid retention.  She denies any TIA or stroke symptoms.         Vitals:    07/09/20 1428 07/09/20 1437   BP: (!) 152/76 (!) 150/76   BP Source: Arm, Left Upper Arm, Right Upper   Patient Position: Sitting Sitting   Pulse: 80    SpO2: 97%    Weight: 59.6 kg (131 lb 6.4 oz)    Height: 1.575 m (5' 2)    PainSc: Zero      Body mass index is 24.03 kg/m?Marland Kitchen     Past Medical History  Patient Active Problem List    Diagnosis Date Noted   ? Preop cardiovascular exam 03/24/2018   ? Carotid artery bruit 01/14/2017     02/09/17 Carotid duplex at [facility]: Mild carotid bulb plaque bilaterally extending of the prox bilateral internal carotid arteries. No hemodynamically significant stenosis.     ? Automatic implantable cardioverter-defibrillator in situ 03/25/2010   ? Coronary artery disease 09/03/2009     5/86 - Anterior MI.      1987 - RCA and LAD angioplasty.      7/99 - Exercise echo: EF 30%, non-ischemic.      7/00 - Gated bruce thallium: no active inducible ischemia.      7/01 - Exercise echo: non-ischemic.  01/07/2018 cath - DES x 1 to LAD      ? Ischemic cardiomyopathy 09/03/2009      7/99 - EF 30% per echo.       7/00 - EF 37% per thallium.       7/01 - EF 25% per echo     ? Hyperlipidemia 09/03/2009     1997- Treatment with Zocor.       7/01 - Switched to Lipitor d/t prescription formulary.       2010 - Switched to Crestor for prescription formulary     ? Hypertension 09/03/2009   ? Mitral regurgitation 09/03/2009   ? Tricuspid regurgitation 09/03/2009   ? Diastolic dysfunction 09/03/2009     Mild     ? Acute MI anterior wall first episode care [facility]) 09/04/1986     S/P PTCA LAD, S/P PTCA RCA           Review of Systems   Constitution: Negative.   HENT: Negative.    Eyes: Negative.    Cardiovascular: Positive for leg swelling.   Respiratory: Negative.  Endocrine: Negative.    Hematologic/Lymphatic: Negative.    Skin: Negative.    Musculoskeletal: Positive for arthritis and joint pain.   Gastrointestinal: Negative.    Genitourinary: Positive for frequency.   Neurological: Negative.    Psychiatric/Behavioral: Negative.    Allergic/Immunologic: Negative.        Physical Exam    Physical Exam   General Appearance: no distress   Skin: warm, no ulcers or xanthomas   Digits and Nails: no cyanosis or clubbing   Eyes: conjunctivae and lids normal, pupils are equal and round   Teeth/Gums/Palate: dentition unremarkable, no lesions   Lips & Oral Mucosa: no pallor or cyanosis   Neck Veins: normal JVP , neck veins are not distended   Thyroid: no nodules, masses, tenderness or enlargement   Chest Inspection: chest is normal in appearance   Respiratory Effort: breathing comfortably, no respiratory distress   Auscultation/Percussion: lungs clear to auscultation, no rales or rhonchi, no wheezing   PMI: PMI not enlarged or displaced   Cardiac Rhythm: regular rhythm and normal rate   Cardiac Auscultation: S1, S2 normal, no rub, no gallop   Murmurs: no murmur   Peripheral Circulation: normal peripheral circulation   Carotid Arteries: normal carotid upstroke bilaterally, no bruits   Radial Arteries: normal symmetric radial pulses   Abdominal Aorta: no abdominal aortic bruit   Pedal Pulses: normal symmetric pedal pulses   Lower Extremity Edema: no lower extremity edema   Abdominal Exam: soft, non-tender, no masses, bowel sounds normal   Liver & Spleen: no organomegaly   Gait & Station: walks without assistance   Muscle Strength: normal muscle tone   Orientation: oriented to time, place and person   Affect & Mood: appropriate and sustained affect   Language and Memory: patient responsive and seems to comprehend information   Neurologic Exam: neurological assessment grossly intact   Other: moves all extremities      Problems Addressed Today  Encounter Diagnoses   Name Primary?   ? Essential hypertension    ? Mixed hyperlipidemia    ? Coronary artery disease involving native coronary artery of native heart without angina pectoris    ? Automatic implantable cardioverter-defibrillator in situ        Assessment and Plan       Hypertension  Goal BP is 130/80 or less, average.  She'll borrow her son's BP monitor and check a few times.  We'll call her in a week or so.    Hyperlipidemia  Lab Results   Component Value Date    CHOL 141 11/17/2019    TRIG 211 (H) 11/17/2019    HDL 34 (L) 11/17/2019    LDL 65 11/17/2019    VLDL 35 05/03/2019    NONHDLCHOL 103 01/08/2018    CHOLHDLC 4 11/17/2019      LDL treated to goal.    Coronary artery disease  Her last coronary intervention was in February, 2019.  She has no symptoms and I have not recommended surveillance stress testing at this point.    Automatic implantable cardioverter-defibrillator in situ  She had a device check a few weeks ago.  Her device functions normally.  She does have runs of nonsustained VT but none of these are symptomatic and she is required no treatments from her device.      Current Medications (including today's revisions)  ? acetaminophen (TYLENOL EXTRA STRENGTH) 500 mg tablet Take 500 mg by mouth Every 6 Hours as needed for Pain.   ? aspirin  EC 81 mg tablet Take 81 mg by mouth Daily.   ? atorvastatin (LIPITOR) 40 mg tablet TAKE 1 TABLET BY MOUTH EVERY NIGHT AT BEDTIME   ? busPIRone (BUSPAR) 15 mg tablet Take 0.5 tablets by mouth twice daily.   ? carvedilol (COREG) 25 mg PO tablet Take 25 mg by mouth Twice Daily With Meals.   ? hydroCHLOROthiazide (HYDRODIURIL) 12.5 mg tablet Take one tablet by mouth every morning.   ? latanoprost (XALATAN) 0.005 % ophthalmic solution Place 1 drop into or around eye(s) at bedtime daily.   ? pantoprazole DR (PROTONIX) 40 mg tablet Take 1 tablet by mouth twice daily.   ? traZODone (DESYREL) 50 mg tablet Take 1 tablet by mouth at bedtime daily.     Total time spent on today's office visit was 30 minutes.  This includes face-to-face in person visit with patient as well as nonface-to-face time including review of the EMR, outside records, labs, radiologic studies, echocardiogram & other cardiovascular studies, formation of treatment plan, after visit summary, future disposition, and lastly on documentation.

## 2020-07-13 ENCOUNTER — Encounter: Admit: 2020-07-13 | Discharge: 2020-07-13 | Payer: MEDICARE | Primary: Family

## 2020-07-13 MED ORDER — HYDROCHLOROTHIAZIDE 12.5 MG PO TAB
12.5 mg | ORAL_TABLET | Freq: Every morning | ORAL | 3 refills
Start: 2020-07-13 — End: ?

## 2020-10-04 ENCOUNTER — Encounter: Admit: 2020-10-04 | Discharge: 2020-10-04 | Payer: MEDICARE | Primary: Family

## 2020-12-29 ENCOUNTER — Encounter: Admit: 2020-12-29 | Discharge: 2020-12-29 | Payer: MEDICARE | Primary: Family

## 2020-12-29 DIAGNOSIS — I2510 Atherosclerotic heart disease of native coronary artery without angina pectoris: Secondary | ICD-10-CM

## 2020-12-29 MED ORDER — ATORVASTATIN 40 MG PO TAB
ORAL_TABLET | Freq: Every evening | 3 refills
Start: 2020-12-29 — End: ?

## 2021-01-05 ENCOUNTER — Encounter: Admit: 2021-01-05 | Discharge: 2021-01-05 | Payer: MEDICARE | Primary: Family

## 2021-01-09 ENCOUNTER — Encounter

## 2021-01-09 DIAGNOSIS — I255 Ischemic cardiomyopathy: Secondary | ICD-10-CM

## 2021-01-09 DIAGNOSIS — Z95810 Presence of automatic (implantable) cardiac defibrillator: Secondary | ICD-10-CM

## 2021-03-12 ENCOUNTER — Encounter: Admit: 2021-03-12 | Discharge: 2021-03-12 | Payer: MEDICARE | Primary: Family

## 2021-03-13 ENCOUNTER — Encounter: Admit: 2021-03-13 | Discharge: 2021-03-13 | Payer: MEDICARE | Primary: Family

## 2021-03-13 DIAGNOSIS — I10 Essential (primary) hypertension: Secondary | ICD-10-CM

## 2021-03-13 DIAGNOSIS — I499 Cardiac arrhythmia, unspecified: Secondary | ICD-10-CM

## 2021-03-13 DIAGNOSIS — E782 Mixed hyperlipidemia: Secondary | ICD-10-CM

## 2021-03-13 DIAGNOSIS — I255 Ischemic cardiomyopathy: Secondary | ICD-10-CM

## 2021-03-13 DIAGNOSIS — I5189 Other ill-defined heart diseases: Secondary | ICD-10-CM

## 2021-03-13 DIAGNOSIS — I2109 ST elevation (STEMI) myocardial infarction involving other coronary artery of anterior wall: Secondary | ICD-10-CM

## 2021-03-13 DIAGNOSIS — I2510 Atherosclerotic heart disease of native coronary artery without angina pectoris: Secondary | ICD-10-CM

## 2021-03-13 DIAGNOSIS — E785 Hyperlipidemia, unspecified: Secondary | ICD-10-CM

## 2021-03-13 DIAGNOSIS — I071 Rheumatic tricuspid insufficiency: Secondary | ICD-10-CM

## 2021-03-13 DIAGNOSIS — I340 Nonrheumatic mitral (valve) insufficiency: Secondary | ICD-10-CM

## 2021-03-13 MED ORDER — LOSARTAN 25 MG PO TAB
25 mg | ORAL_TABLET | Freq: Every day | ORAL | 11 refills | 30.00000 days | Status: AC
Start: 2021-03-13 — End: ?

## 2021-03-13 NOTE — Progress Notes
Date of Service: 03/13/2021    Grace Wright is a 85 y.o. female.       HPI     Grace Wright was in the [place] clinic today for follow-up regarding coronary disease, ICD, and ischemic cardiomyopathy.  She is remained completely retired but she stays very active socially and gets out of the house every day.  She is basically asymptomatic but occasionally she will have times when her breathing is a little harder than others.    She monitors her blood pressure and she has been a little surprised that its been higher lately.  When we go back and look at what her blood pressure has done in the clinic it was pretty good a year ago but last August it was starting to creep upward and today it was definitely higher than it should be.    She had times in the past when her blood pressure was too low.  We had her on ARB's as recently as 2019 but we stopped the losartan at that point because her blood pressure had become a little bit on the low side and she was symptomatic.         Vitals:    03/13/21 1510 03/13/21 1520   BP: (!) 158/92 (!) 156/88   BP Source: Arm, Right Upper Arm, Left Upper   Pulse: 79    SpO2: 98%    O2 Device: None (Room air)    PainSc: Zero    Weight: 60.8 kg (134 lb)    Height: 157.5 cm (5' 2)      Body mass index is 24.51 kg/m?Marland Kitchen     Past Medical History  Patient Active Problem List    Diagnosis Date Noted   ? Preop cardiovascular exam 03/24/2018   ? Carotid artery bruit 01/14/2017     02/09/17 Carotid duplex at [facility]: Mild carotid bulb plaque bilaterally extending of the prox bilateral internal carotid arteries. No hemodynamically significant stenosis.     ? Automatic implantable cardioverter-defibrillator in situ 03/25/2010   ? Coronary artery disease 09/03/2009     5/86 - Anterior MI.      1987 - RCA and LAD angioplasty.      7/99 - Exercise echo: EF 30%, non-ischemic.      7/00 - Gated bruce thallium: no active inducible ischemia.      7/01 - Exercise echo: non-ischemic.  01/07/2018 cath - DES x 1 to LAD      ? Ischemic cardiomyopathy 09/03/2009      7/99 - EF 30% per echo.       7/00 - EF 37% per thallium.       7/01 - EF 25% per echo     ? Hyperlipidemia 09/03/2009     1997- Treatment with Zocor.       7/01 - Switched to Lipitor d/t prescription formulary.       2010 - Switched to Crestor for prescription formulary     ? Hypertension 09/03/2009   ? Mitral regurgitation 09/03/2009   ? Tricuspid regurgitation 09/03/2009   ? Diastolic dysfunction 09/03/2009     Mild     ? Acute MI anterior wall first episode care [facility]) 09/04/1986     S/P PTCA LAD, S/P PTCA RCA           Review of Systems   Constitutional: Negative.   HENT: Negative.    Eyes: Negative.    Cardiovascular: Negative.    Respiratory: Negative.    Endocrine:  Negative.    Hematologic/Lymphatic: Negative.    Skin: Negative.    Musculoskeletal: Negative.    Gastrointestinal: Negative.    Genitourinary: Negative.    Neurological: Negative.    Psychiatric/Behavioral: Negative.    Allergic/Immunologic: Negative.        Physical Exam    Physical Exam   General Appearance: no distress   Skin: warm, no ulcers or xanthomas   Digits and Nails: no cyanosis or clubbing   Eyes: conjunctivae and lids normal, pupils are equal and round   Teeth/Gums/Palate: dentition unremarkable, no lesions   Lips & Oral Mucosa: no pallor or cyanosis   Neck Veins: normal JVP , neck veins are not distended   Thyroid: no nodules, masses, tenderness or enlargement   Chest Inspection: chest is normal in appearance   Respiratory Effort: breathing comfortably, no respiratory distress   Auscultation/Percussion: lungs clear to auscultation, no rales or rhonchi, no wheezing   PMI: PMI not enlarged or displaced   Cardiac Rhythm: regular rhythm and normal rate   Cardiac Auscultation: S1, S2 normal, no rub, no gallop   Murmurs: no murmur   Peripheral Circulation: normal peripheral circulation   Carotid Arteries: normal carotid upstroke bilaterally, no bruits   Radial Arteries: normal symmetric radial pulses   Abdominal Aorta: no abdominal aortic bruit   Pedal Pulses: normal symmetric pedal pulses   Lower Extremity Edema: no lower extremity edema   Abdominal Exam: soft, non-tender, no masses, bowel sounds normal   Liver & Spleen: no organomegaly   Gait & Station: walks without assistance   Muscle Strength: normal muscle tone   Orientation: oriented to time, place and person   Affect & Mood: appropriate and sustained affect   Language and Memory: patient responsive and seems to comprehend information   Neurologic Exam: neurological assessment grossly intact   Other: moves all extremities      Cardiovascular Health Factors  Vitals BP Readings from Last 3 Encounters:   03/13/21 (!) 156/88   07/09/20 (!) 150/76   12/14/19 128/80     Wt Readings from Last 3 Encounters:   03/13/21 60.8 kg (134 lb)   07/09/20 59.6 kg (131 lb 6.4 oz)   12/14/19 59 kg (130 lb)     BMI Readings from Last 3 Encounters:   03/13/21 24.51 kg/m?   07/09/20 24.03 kg/m?   12/14/19 23.78 kg/m?      Smoking Social History     Tobacco Use   Smoking Status Former Smoker   ? Packs/day: 0.50   ? Years: 20.00   ? Pack years: 10.00   ? Types: Cigarettes   ? Quit date: 11/30/1984   ? Years since quitting: 36.3   Smokeless Tobacco Never Used      Lipid Profile Cholesterol   Date Value Ref Range Status   11/13/2020 147  Final     HDL   Date Value Ref Range Status   11/13/2020 28 (L) >=40 Final     LDL   Date Value Ref Range Status   11/13/2020 72  Final     Triglycerides   Date Value Ref Range Status   11/13/2020 234 (H) <150 Final      Blood Sugar Hemoglobin A1C   Date Value Ref Range Status   12/25/2016 5.8  Final     Glucose   Date Value Ref Range Status   11/13/2020 108 (H) 70 - 105 Final   11/17/2019 104  Final   12/30/2018 75  Final  06/27/2003 105 70 - 110 MG/DL Final          Problems Addressed Today  Encounter Diagnoses   Name Primary?   ? Ischemic cardiomyopathy    ? Mixed hyperlipidemia    ? Primary hypertension        Assessment and Plan Ischemic cardiomyopathy  I do not think she has any current manifestations for heart failure.  With her blood pressure elevation I suspect that some of her occasional breathlessness is likely to be related to elevated blood pressure.    Hyperlipidemia  Lab Results   Component Value Date    CHOL 147 11/13/2020    TRIG 234 (H) 11/13/2020    HDL 28 (L) 11/13/2020    LDL 72 11/13/2020    VLDL 47 (H) 11/13/2020    NONHDLCHOL 103 01/08/2018    CHOLHDLC 5 11/13/2020          Hypertension  I added losartan 25 mg/day.  She has been on losartan in the past but wanted to start the medication gradually so that she does not get into low blood pressure problems again.      Current Medications (including today's revisions)  ? acetaminophen (TYLENOL EXTRA STRENGTH) 500 mg tablet Take 500 mg by mouth Every 6 Hours as needed for Pain.   ? aspirin EC 81 mg tablet Take 81 mg by mouth Daily.   ? atorvastatin (LIPITOR) 40 mg tablet TAKE 1 TABLET BY MOUTH EVERY NIGHT AT BEDTIME   ? busPIRone (BUSPAR) 15 mg tablet Take 0.5 tablets by mouth twice daily.   ? carvedilol (COREG) 25 mg PO tablet Take 25 mg by mouth Twice Daily With Meals.   ? hydroCHLOROthiazide 12.5 mg tablet TAKE ONE TABLET BY MOUTH EVERY MORNING   ? latanoprost (XALATAN) 0.005 % ophthalmic solution Place 1 drop into or around eye(s) at bedtime daily.   ? losartan (COZAAR) 25 mg tablet Take one tablet by mouth daily for 30 days.   ? pantoprazole DR (PROTONIX) 40 mg tablet Take 1 tablet by mouth twice daily.   ? traZODone (DESYREL) 50 mg tablet Take 1 tablet by mouth at bedtime daily.     Total time spent on today's office visit was 30 minutes.  This includes face-to-face in person visit with patient as well as nonface-to-face time including review of the EMR, outside records, labs, radiologic studies, echocardiogram & other cardiovascular studies, formation of treatment plan, after visit summary, future disposition, and lastly on documentation.

## 2021-03-13 NOTE — Assessment & Plan Note
I do not think she has any current manifestations for heart failure.  With her blood pressure elevation I suspect that some of her occasional breathlessness is likely to be related to elevated blood pressure.

## 2021-03-13 NOTE — Assessment & Plan Note
I added losartan 25 mg/day.  She has been on losartan in the past but wanted to start the medication gradually so that she does not get into low blood pressure problems again.

## 2021-03-13 NOTE — Assessment & Plan Note
Lab Results   Component Value Date    CHOL 147 11/13/2020    TRIG 234 (H) 11/13/2020    HDL 28 (L) 11/13/2020    LDL 72 11/13/2020    VLDL 47 (H) 11/13/2020    NONHDLCHOL 103 01/08/2018    CHOLHDLC 5 11/13/2020

## 2021-03-13 NOTE — Patient Instructions
Goal BP is 130/80 or less, on average  Start losartan 25 mg/day--Rx sent to [facility]  Bring home BP monitor to the clinic to verify its accuracy

## 2021-04-10 ENCOUNTER — Encounter: Admit: 2021-04-10 | Discharge: 2021-04-10 | Payer: MEDICARE | Primary: Family

## 2021-04-10 NOTE — Telephone Encounter
Received notification that  [employer] Carelink has not been connected since 03/25/21. Patient was scheduled for a [employer] Carelink on 04/04/21 that has not been received.   Patient was instructed to look at his/her transmitter to make sure that it is plugged into power and send a manual transmission to reconnect the transmitter. If he/she has any questions about how to send a transmission or if the transmitter does not appear to be working properly, they need to contact the device company directly. Patient was provided with that contact number. Requested the patient send [name] a MyChart message or contact our device nurses at [phone] to let us know after they have sent their transmission. Call placed to preferred phone number, got no answer LVM. MyChart is not set up.     CDJ

## 2021-04-10 NOTE — Telephone Encounter
Patient returned call from [facility] about her disconnected monitor. I told her to send in a transmission. She said may be a day or two before she can get to it. I gave her number to Carelink if she has issues. I flagged EP remote pool to check on this in a few days.

## 2021-04-15 ENCOUNTER — Encounter: Admit: 2021-04-15 | Discharge: 2021-04-15 | Payer: MEDICARE | Primary: Family

## 2021-04-15 DIAGNOSIS — Z95810 Presence of automatic (implantable) cardiac defibrillator: Secondary | ICD-10-CM

## 2021-06-27 ENCOUNTER — Encounter: Admit: 2021-06-27 | Discharge: 2021-06-27 | Payer: MEDICARE | Primary: Family

## 2021-06-27 MED ORDER — HYDROCHLOROTHIAZIDE 12.5 MG PO TAB
12.5 mg | ORAL_TABLET | Freq: Every morning | ORAL | 3 refills | 30.00000 days | Status: AC
Start: 2021-06-27 — End: ?

## 2021-07-16 ENCOUNTER — Encounter: Admit: 2021-07-16 | Discharge: 2021-07-16 | Payer: MEDICARE | Primary: Family

## 2021-09-11 ENCOUNTER — Encounter: Admit: 2021-09-11 | Discharge: 2021-09-11 | Payer: MEDICARE | Primary: Family

## 2021-09-11 DIAGNOSIS — I2109 ST elevation (STEMI) myocardial infarction involving other coronary artery of anterior wall: Secondary | ICD-10-CM

## 2021-09-11 DIAGNOSIS — E782 Mixed hyperlipidemia: Secondary | ICD-10-CM

## 2021-09-11 DIAGNOSIS — I071 Rheumatic tricuspid insufficiency: Secondary | ICD-10-CM

## 2021-09-11 DIAGNOSIS — I2510 Atherosclerotic heart disease of native coronary artery without angina pectoris: Secondary | ICD-10-CM

## 2021-09-11 DIAGNOSIS — E785 Hyperlipidemia, unspecified: Secondary | ICD-10-CM

## 2021-09-11 DIAGNOSIS — I5189 Other ill-defined heart diseases: Secondary | ICD-10-CM

## 2021-09-11 DIAGNOSIS — I340 Nonrheumatic mitral (valve) insufficiency: Secondary | ICD-10-CM

## 2021-09-11 DIAGNOSIS — I10 Essential (primary) hypertension: Secondary | ICD-10-CM

## 2021-09-11 DIAGNOSIS — I499 Cardiac arrhythmia, unspecified: Secondary | ICD-10-CM

## 2021-09-11 DIAGNOSIS — I255 Ischemic cardiomyopathy: Secondary | ICD-10-CM

## 2021-09-11 NOTE — Assessment & Plan Note
Her last ischemia evaluation was an arteriogram in 2019 at which time her mid LAD was stented.  She has had no symptoms to suggest progression of her coronary disease and we have not done stress testing since then.

## 2021-09-11 NOTE — Assessment & Plan Note
Her blood pressure was a little elevated today.  I have asked her to check it regularly at home and we will give her a call in a couple of weeks.  The goal for her is an average of 130/80 or less.

## 2021-09-11 NOTE — Progress Notes
Date of Service: 09/11/2021    Grace Wright is a 85 y.o. female.       HPI     Grace Wright was in the [place] clinic today for follow-up regarding coronary disease, ischemic cardiomyopathy, and ICD.  She retired at the beginning of the pandemic.  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    She is doing fine from a cardiovascular standpoint but she was quite ill with COVID earlier this year and required a brief hospitalization.  She has had some ongoing problems with autonomic dysfunction that primarily involve her GI tract.    She denies any problems with breathlessness or chest discomfort.  She has a little bit of venous insufficiency and left ankle edema at times.  She denies any palpitations, syncope, or near syncope.  She has had no TIA or stroke symptoms.       Vitals:    09/11/21 1410   BP: (!) 150/84   BP Source: Arm, Left Upper   Pulse: 89   SpO2: 96%   O2 Device: None (Room air)   PainSc: Zero   Weight: 61.7 kg (136 lb)   Height: 157.5 cm (5' 2)     Body mass index is 24.87 kg/m?Marland Kitchen     Past Medical History  Patient Active Problem List    Diagnosis Date Noted   ? Preop cardiovascular exam 03/24/2018   ? Carotid artery bruit 01/14/2017     02/09/17 Carotid duplex at [facility]: Mild carotid bulb plaque bilaterally extending of the prox bilateral internal carotid arteries. No hemodynamically significant stenosis.     ? Automatic implantable cardioverter-defibrillator in situ 03/25/2010   ? Coronary artery disease 09/03/2009     5/86 - Anterior MI.      1987 - RCA and LAD angioplasty.      7/99 - Exercise echo: EF 30%, non-ischemic.      7/00 - Gated bruce thallium: no active inducible ischemia.      7/01 - Exercise echo: non-ischemic.  01/07/2018 cath - DES x 1 to LAD      ? Ischemic cardiomyopathy 09/03/2009      7/99 - EF 30% per echo.       7/00 - EF 37% per thallium.       7/01 - EF 25% per echo     ? Hyperlipidemia 09/03/2009 1997- Treatment with Zocor.       7/01 - Switched to Lipitor d/t prescription formulary.       2010 - Switched to Crestor for prescription formulary     ? Hypertension 09/03/2009   ? Mitral regurgitation 09/03/2009   ? Tricuspid regurgitation 09/03/2009   ? Diastolic dysfunction 09/03/2009     Mild     ? Acute MI anterior wall first episode care [facility]) 09/04/1986     S/P PTCA LAD, S/P PTCA RCA           Review of Systems   Constitutional: Negative.   HENT: Negative.    Eyes: Negative.    Cardiovascular: Positive for leg swelling.   Respiratory: Negative.    Endocrine: Negative.    Hematologic/Lymphatic: Negative.    Skin: Negative.    Musculoskeletal: Negative.    Gastrointestinal: Negative.    Genitourinary: Negative.    Neurological: Negative.    Psychiatric/Behavioral: Negative.    Allergic/Immunologic: Negative.        Physical Exam  Physical Exam   General Appearance: no distress   Skin: warm, no ulcers or xanthomas   Digits and Nails: no cyanosis or clubbing   Eyes: conjunctivae and lids normal, pupils are equal and round   Teeth/Gums/Palate: dentition unremarkable, no lesions   Lips & Oral Mucosa: no pallor or cyanosis   Neck Veins: normal JVP , neck veins are not distended   Thyroid: no nodules, masses, tenderness or enlargement   Chest Inspection: chest is normal in appearance   Respiratory Effort: breathing comfortably, no respiratory distress   Auscultation/Percussion: lungs clear to auscultation, no rales or rhonchi, no wheezing   PMI: PMI not enlarged or displaced   Cardiac Rhythm: regular rhythm and normal rate   Cardiac Auscultation: S1, S2 normal, no rub, no gallop   Murmurs: no murmur   Peripheral Circulation: normal peripheral circulation   Carotid Arteries: normal carotid upstroke bilaterally, no bruits   Radial Arteries: normal symmetric radial pulses   Abdominal Aorta: no abdominal aortic bruit   Pedal Pulses: normal symmetric pedal pulses   Lower Extremity Edema: no lower extremity edema Abdominal Exam: soft, non-tender, no masses, bowel sounds normal   Liver & Spleen: no organomegaly   Gait & Station: walks without assistance   Muscle Strength: normal muscle tone   Orientation: oriented to time, place and person   Affect & Mood: appropriate and sustained affect   Language and Memory: patient responsive and seems to comprehend information   Neurologic Exam: neurological assessment grossly intact   Other: moves all extremities      Cardiovascular Health Factors  Vitals BP Readings from Last 3 Encounters:   09/11/21 (!) 150/84   03/13/21 (!) 156/88   07/09/20 (!) 150/76     Wt Readings from Last 3 Encounters:   09/11/21 61.7 kg (136 lb)   03/13/21 60.8 kg (134 lb)   07/09/20 59.6 kg (131 lb 6.4 oz)     BMI Readings from Last 3 Encounters:   09/11/21 24.87 kg/m?   03/13/21 24.51 kg/m?   07/09/20 24.03 kg/m?      Smoking Social History     Tobacco Use   Smoking Status Former Smoker   ? Packs/day: 0.50   ? Years: 20.00   ? Pack years: 10.00   ? Types: Cigarettes   ? Quit date: 11/30/1984   ? Years since quitting: 36.8   Smokeless Tobacco Never Used      Lipid Profile Cholesterol   Date Value Ref Range Status   11/13/2020 147  Final     HDL   Date Value Ref Range Status   11/13/2020 28 (L) >=40 Final     LDL   Date Value Ref Range Status   11/13/2020 72  Final     Triglycerides   Date Value Ref Range Status   11/13/2020 234 (H) <150 Final      Blood Sugar Hemoglobin A1C   Date Value Ref Range Status   12/25/2016 5.8  Final     Glucose   Date Value Ref Range Status   11/13/2020 108 (H) 70 - 105 Final   11/17/2019 104  Final   12/30/2018 75  Final   06/27/2003 105 70 - 110 MG/DL Final          Problems Addressed Today  Encounter Diagnoses   Name Primary?   ? Coronary artery disease involving native coronary artery of native heart without angina pectoris    ? Mixed hyperlipidemia    ? Primary  hypertension        Assessment and Plan       Coronary artery disease  Her last ischemia evaluation was an arteriogram in 2019 at which time her mid LAD was stented.  She has had no symptoms to suggest progression of her coronary disease and we have not done stress testing since then.    Hyperlipidemia  Lab Results   Component Value Date    CHOL 147 11/13/2020    TRIG 234 (H) 11/13/2020    HDL 28 (L) 11/13/2020    LDL 72 11/13/2020    VLDL 47 (H) 11/13/2020    NONHDLCHOL 103 01/08/2018    CHOLHDLC 5 11/13/2020      LDL close to goal.    Hypertension  Her blood pressure was a little elevated today.  I have asked her to check it regularly at home and we will give her a call in a couple of weeks.  The goal for her is an average of 130/80 or less.      Current Medications (including today's revisions)  ? acetaminophen (TYLENOL EXTRA STRENGTH) 500 mg tablet Take 500 mg by mouth Every 6 Hours as needed for Pain.   ? aspirin EC 81 mg tablet Take 81 mg by mouth Daily.   ? atorvastatin (LIPITOR) 40 mg tablet TAKE 1 TABLET BY MOUTH EVERY NIGHT AT BEDTIME   ? busPIRone (BUSPAR) 15 mg tablet Take 0.5 tablets by mouth twice daily.   ? carvediloL (COREG) 25 mg tablet Take 25 mg by mouth Twice Daily With Meals.   ? hydroCHLOROthiazide 12.5 mg tablet TAKE ONE TABLET BY MOUTH EVERY MORNING   ? latanoprost (XALATAN) 0.005 % ophthalmic solution Place 1 drop into or around eye(s) at bedtime daily.   ? losartan (COZAAR) 50 mg tablet Take 50 mg by mouth daily.   ? pantoprazole DR (PROTONIX) 40 mg tablet Take 1 tablet by mouth twice daily.   ? traZODone (DESYREL) 50 mg tablet Take 1 tablet by mouth at bedtime daily.     Total time spent on today's office visit was 30 minutes.  This includes face-to-face in person visit with patient as well as nonface-to-face time including review of the EMR, outside records, labs, radiologic studies, echocardiogram & other cardiovascular studies, formation of treatment plan, after visit summary, future disposition, and lastly on documentation.

## 2021-09-11 NOTE — Patient Instructions
Checking resting home blood pressure.  Goal is 130/80 or less.

## 2021-09-11 NOTE — Assessment & Plan Note
Lab Results   Component Value Date    CHOL 147 11/13/2020    TRIG 234 (H) 11/13/2020    HDL 28 (L) 11/13/2020    LDL 72 11/13/2020    VLDL 47 (H) 11/13/2020    NONHDLCHOL 103 01/08/2018    CHOLHDLC 5 11/13/2020      LDL close to goal.

## 2021-09-26 ENCOUNTER — Encounter: Admit: 2021-09-26 | Discharge: 2021-09-26 | Payer: MEDICARE | Primary: Family

## 2021-09-26 NOTE — Telephone Encounter
-----   Message from Weston Brass sent at 09/11/2021  3:28 PM CDT -----  Regarding: call Delaney    ----- Message -----  From: Vanice Sarah, MD  Sent: 09/11/2021   2:44 PM CDT  To: Cvm Nurse [place]    Can you please call [name] in 2 weeks to check on home BP readings?

## 2021-09-26 NOTE — Telephone Encounter
Called Grace Wright to follow up on BP readings.  She states that they are "all over the place, but much better".  She states that they run between 114 and 136 systolic and 60-70 diastolic.  She states that they are mostly in the 120/60s range.  She does not check her heart rate.  She states that she is feeling good and is happy that she hasn't had any readings that are high like they were in the 170s.    She will continue her medication as prescribed.

## 2021-10-14 ENCOUNTER — Encounter: Admit: 2021-10-14 | Discharge: 2021-10-14 | Payer: MEDICARE | Primary: Family

## 2021-10-14 NOTE — Telephone Encounter
Called pt to discuss.  Grace Wright states that she is feeling great.  She has not noticed any palpitations.  She denies any chest pain or shortness of breath. She doesn't remember any issues on 11/1.  She does not check her HR and BP regularly.    Will route to Dr. Barry Dienes for recommendations.

## 2021-10-14 NOTE — Telephone Encounter
Vanice Sarah, MD  Rogelia Boga, RN  Caller: Unspecified (Today, 12:12 PM)  OK, pretty minimal duration--no OAC at this point thanks.

## 2021-10-14 NOTE — Telephone Encounter
-----   Message from Connye Burkitt, RN sent at 10/14/2021 11:03 AM CST -----  Regarding: FW: SDO possible AFib with no OAC    ----- Message -----  From: Marisa Sprinkles, RN  Sent: 10/14/2021  11:00 AM CST  To: Cvm Nurse [facility] Team Red  Subject: SDO possible AFib with no OAC                    Hi,    We received a remote transmission on 11/11. In this transmission there was a NSVT detection 11/1 at 07:54 which isn't true NSVT. It starts out as AT then turns into what appears to be AFib with 6sec noticed on EGM. I do not see an OAC on the medication list.     Thanks,   Brittney/device team

## 2021-10-29 ENCOUNTER — Ambulatory Visit: Admit: 2021-10-29 | Discharge: 2021-10-29 | Payer: MEDICARE | Primary: Family

## 2021-10-29 DIAGNOSIS — Z95810 Presence of automatic (implantable) cardiac defibrillator: Secondary | ICD-10-CM

## 2021-10-30 ENCOUNTER — Encounter: Admit: 2021-10-30 | Discharge: 2021-10-30 | Payer: MEDICARE | Primary: Family

## 2021-12-31 IMAGING — CR [ID]
2 series · 2 of 2 positions shown · non-contrast
Comparison: none

[chest pa]
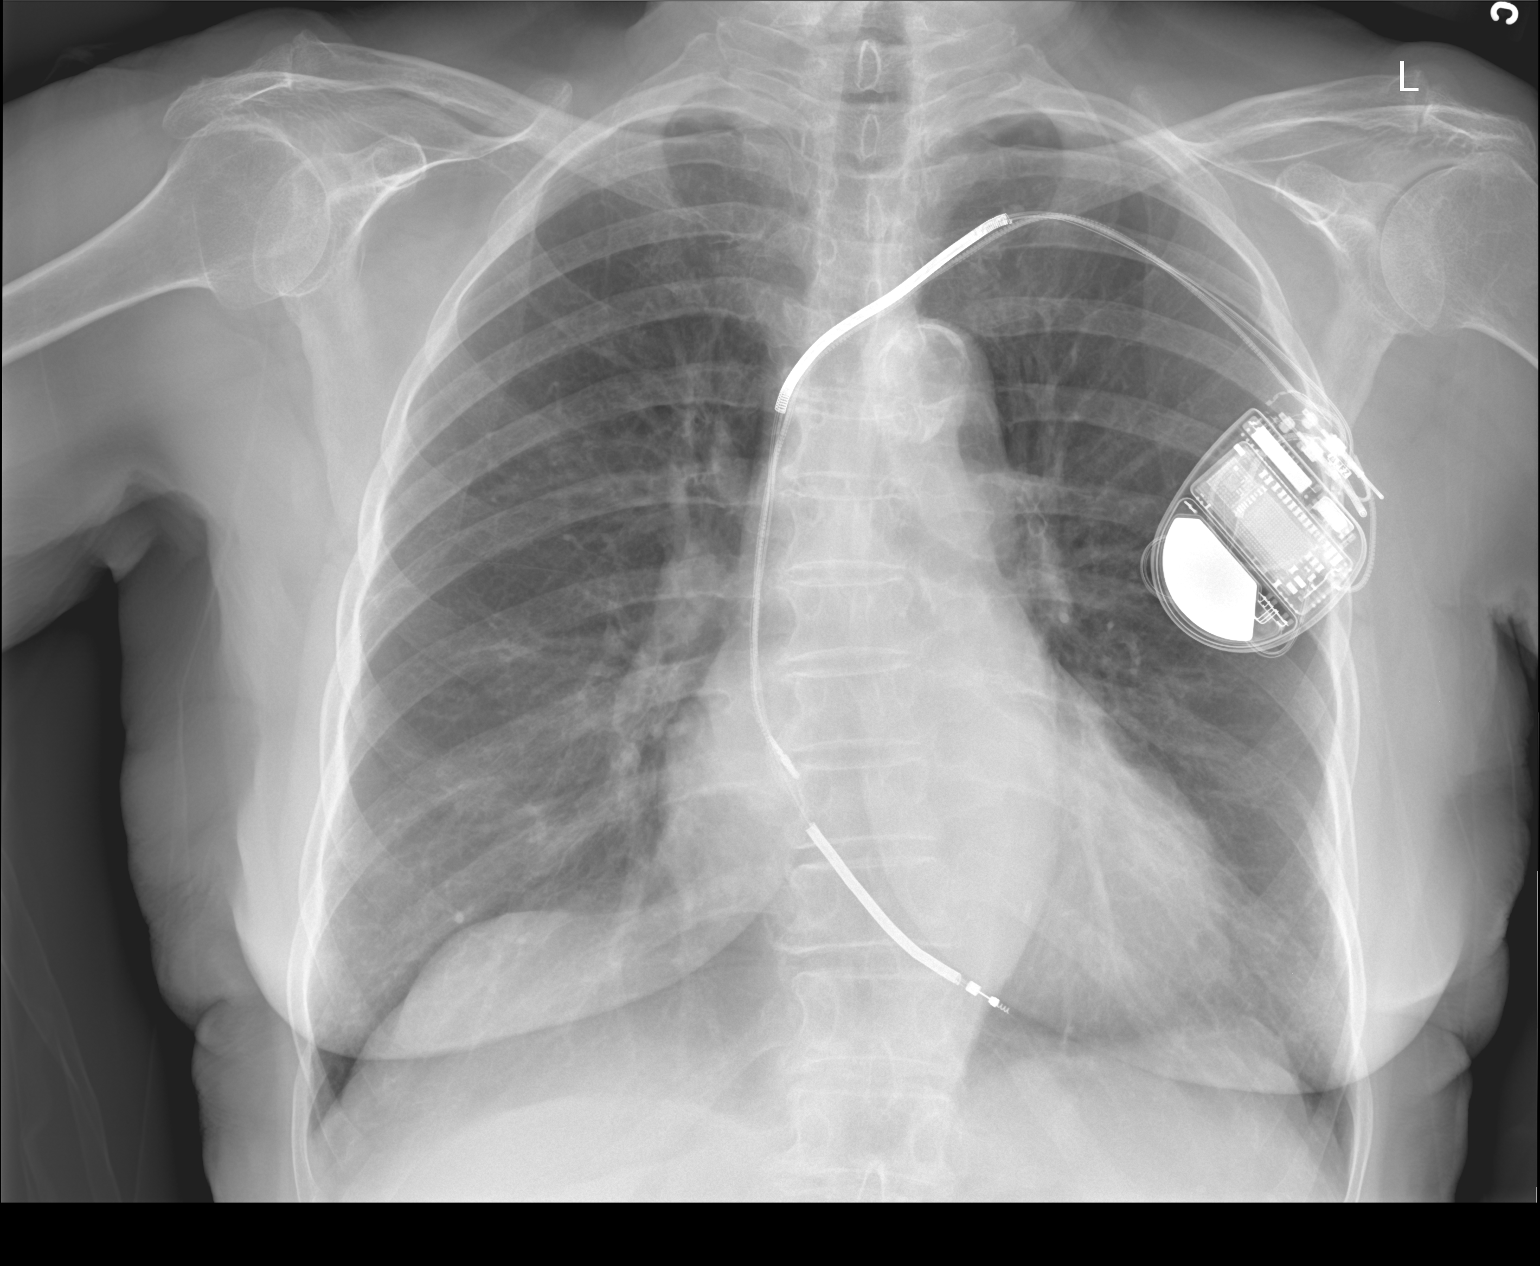

[chest lat]
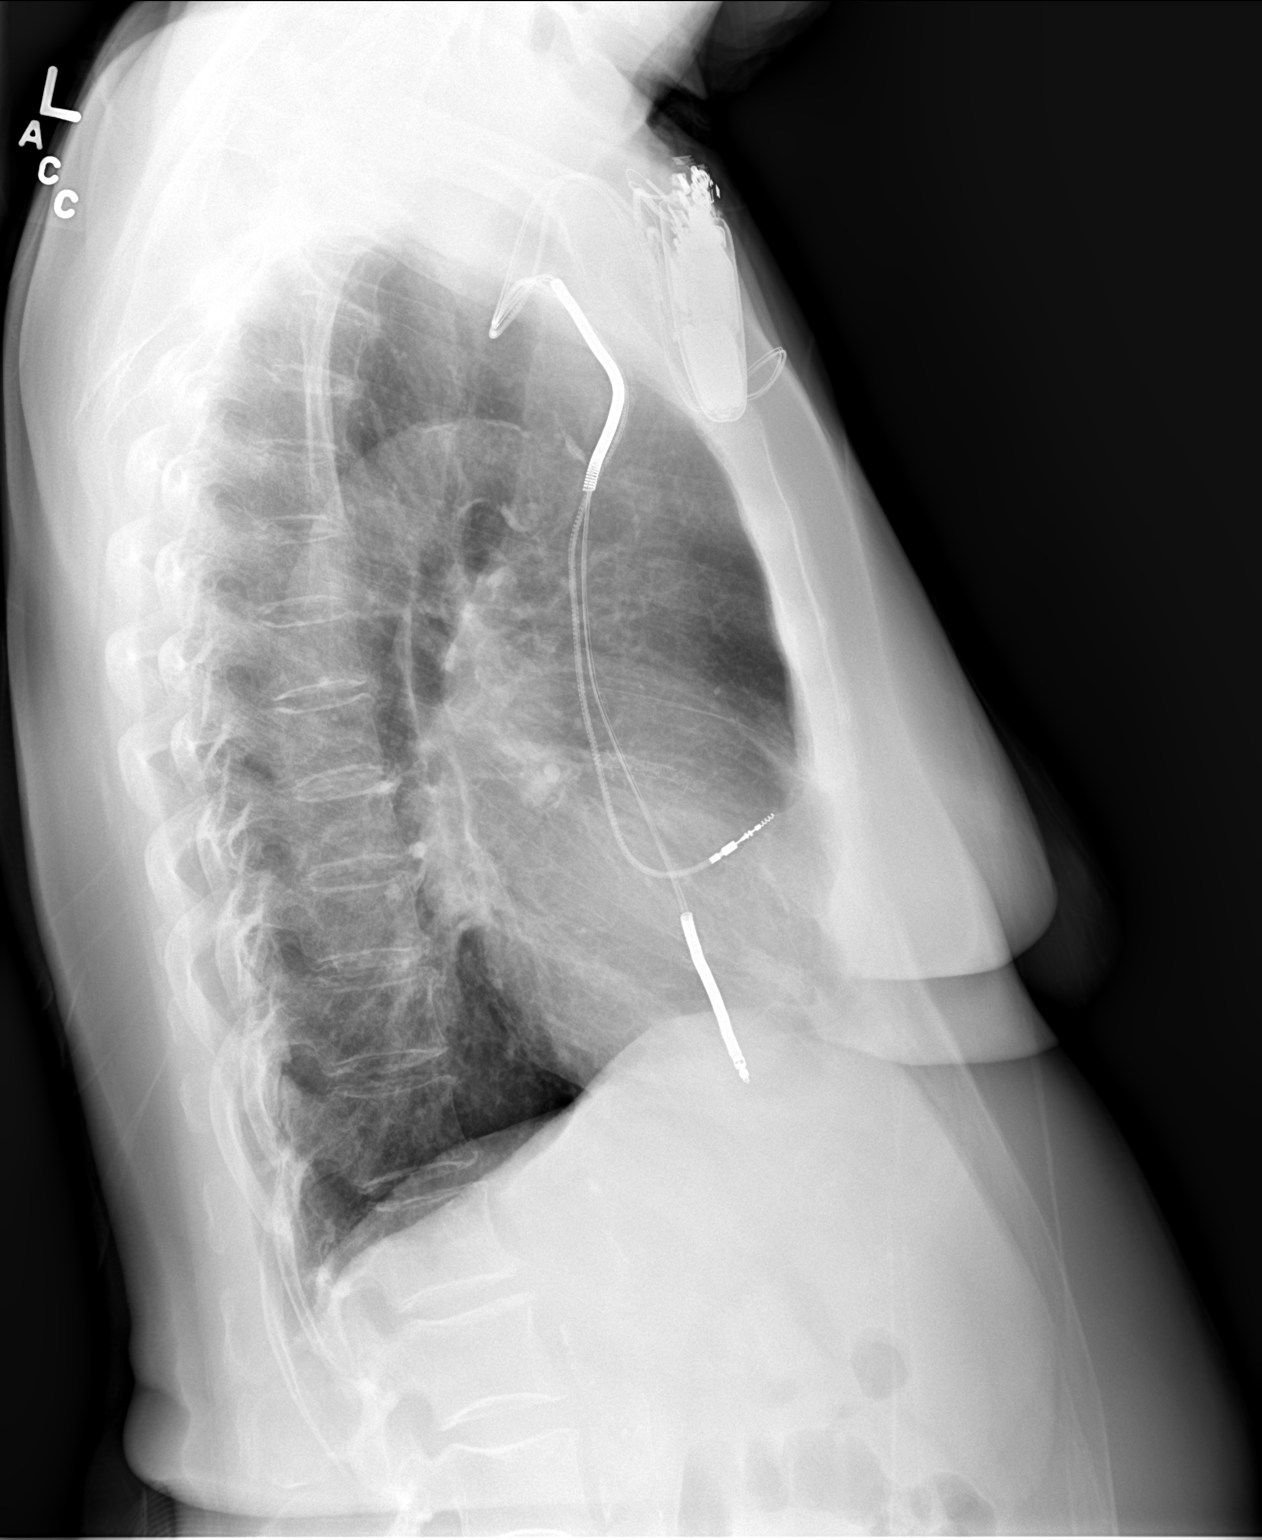

[2 of 2 positions shown; findings below may reference images not displayed]

DIAGNOSTIC STUDIES

EXAM

XR chest 2V

INDICATION

chest pain/cough post COVID
Chest pain. Hx post covid 04/09/21

TECHNIQUE

PA and lateral views of the chest.

COMPARISONS

April 20, 2021

FINDINGS

Cardiac silhouette is unchanged with pacemaker in place. No acute infiltrates are seen. Osseous
structures are stable.

IMPRESSION

No acute infiltrates throughout the chest. Pacemaker is in place.

Tech Notes:

Chest pain. Hx post covid 04/09/21

## 2021-12-31 IMAGING — CT PE(Adult)
3 of 5 series · 15 of 36 positions shown · non-contrast
Comparison: none

[Series 4: cta pulmonary ax 2.00 bv36 s3 · axial · 0.52mm/px · z∈[+1670,+1760]mm · 3 of 182 slices shown]
[im 23/182  lung]
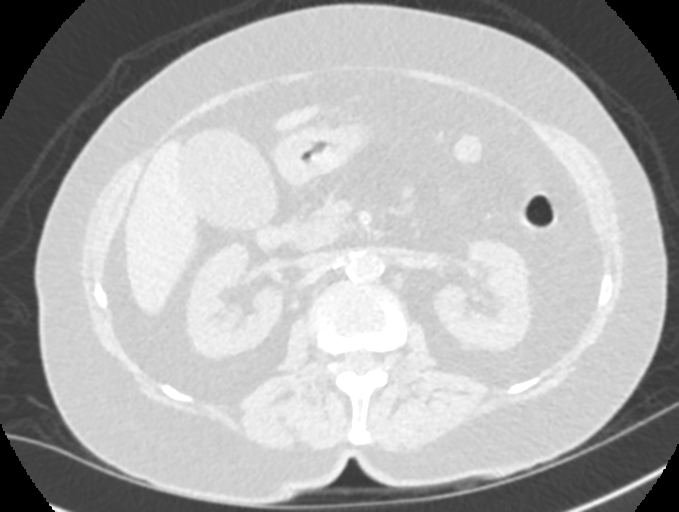
[im 46/182  lung]
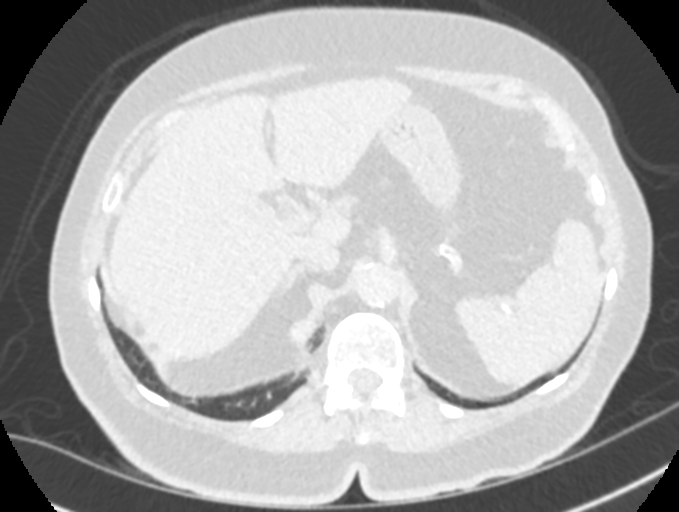
[im 68/182  lung]
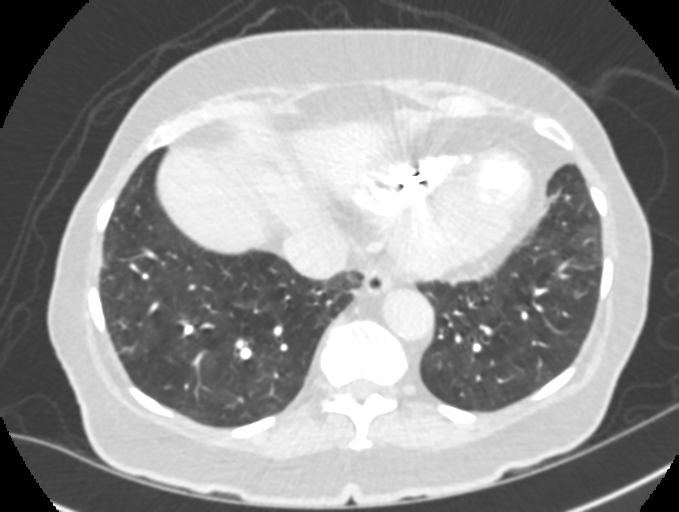

[Series 6: cta pulmonary cor 2.00 bv36 s3 · coronal · 0.69mm/px · 3 of 132 slices shown]
[im 27/132  mediastinal]
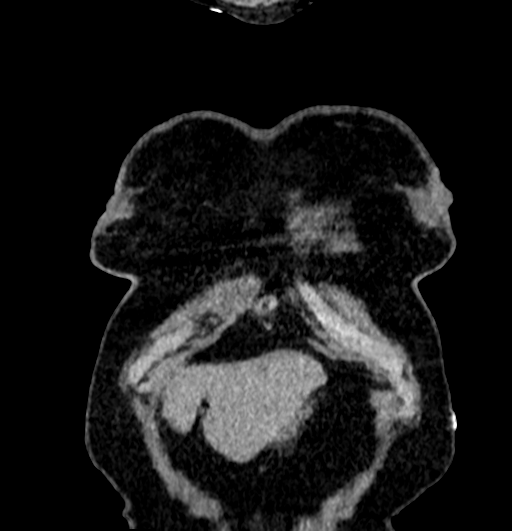
[im 53/132  mediastinal]
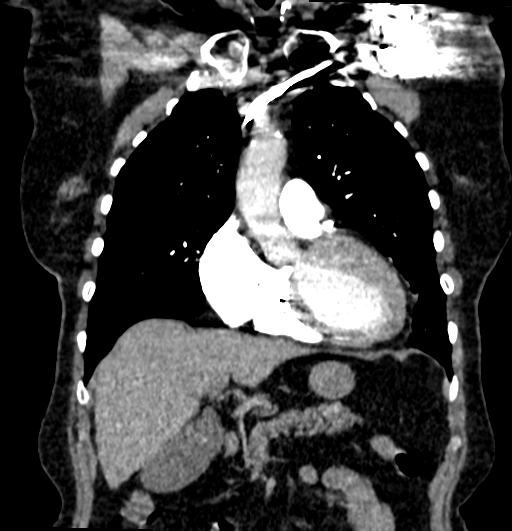
[im 79/132  mediastinal]
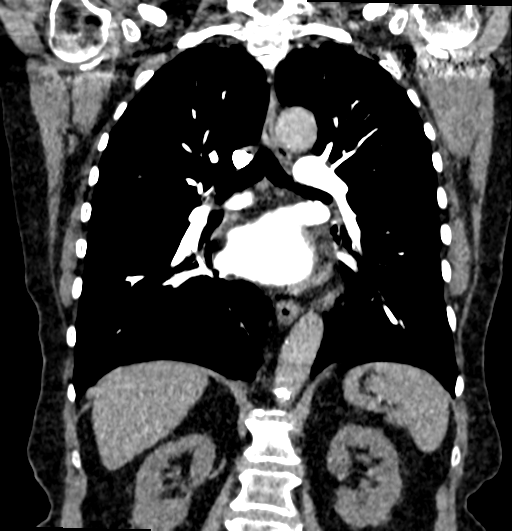

[Series 12: cta pulmonary ax 1.50 br60 s3 · axial · 0.52mm/px · z∈[+1663,+1950]mm · 9 of 242 slices shown]
[im 25/242  lung]
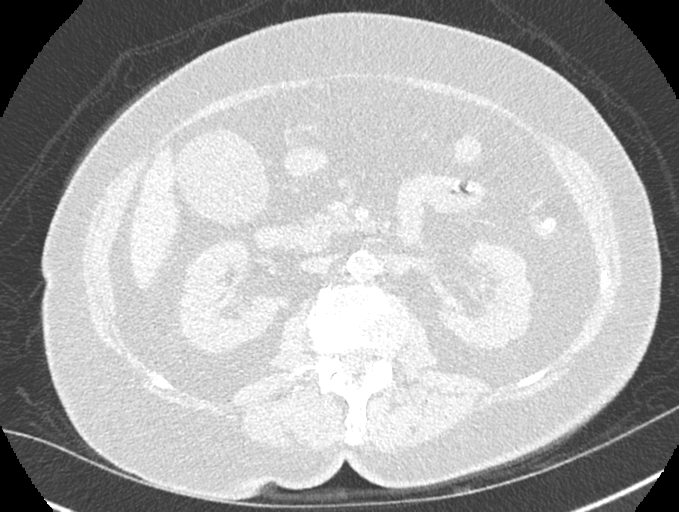
[im 49/242  mediastinal]
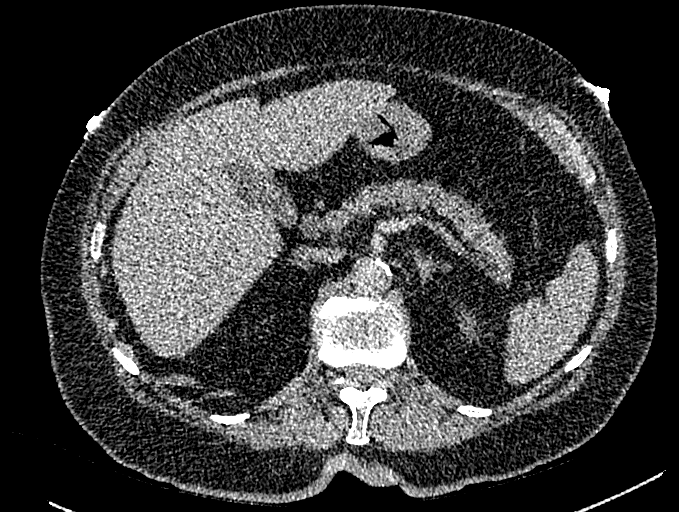
[im 73/242  lung]
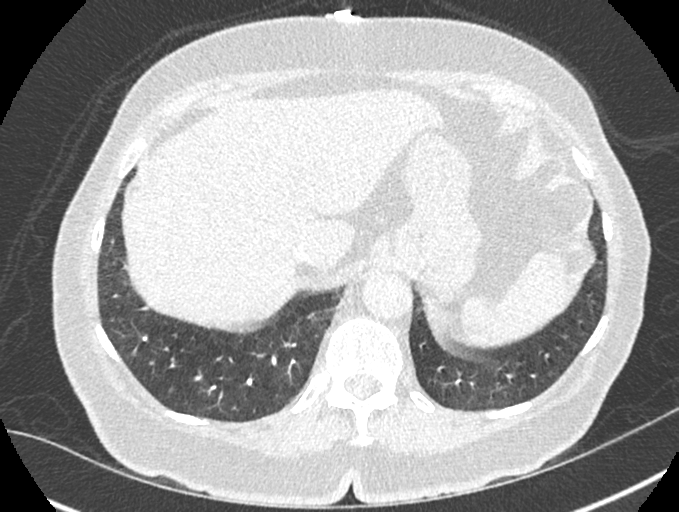
[im 97/242  mediastinal]
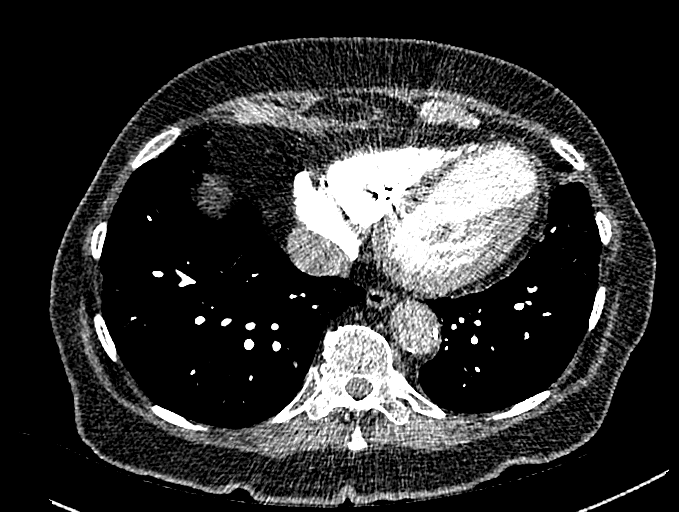
[im 121/242  lung]
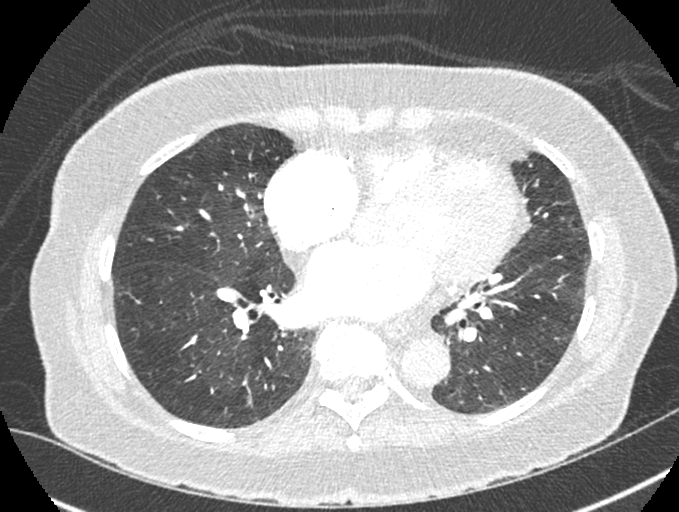
[im 145/242  mediastinal]
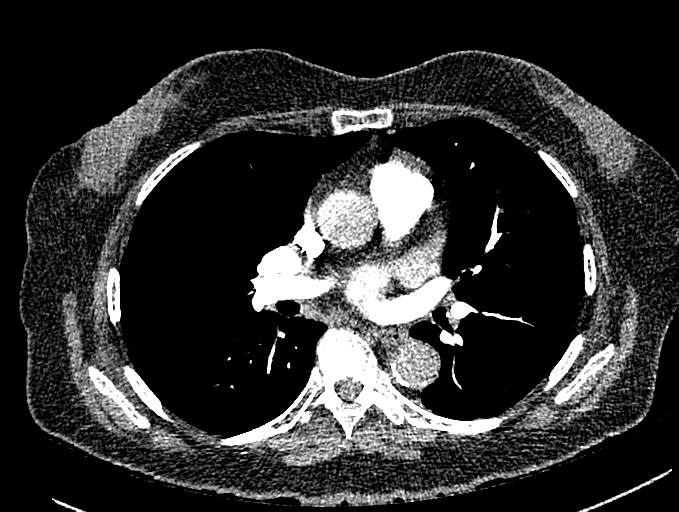
[im 169/242  lung]
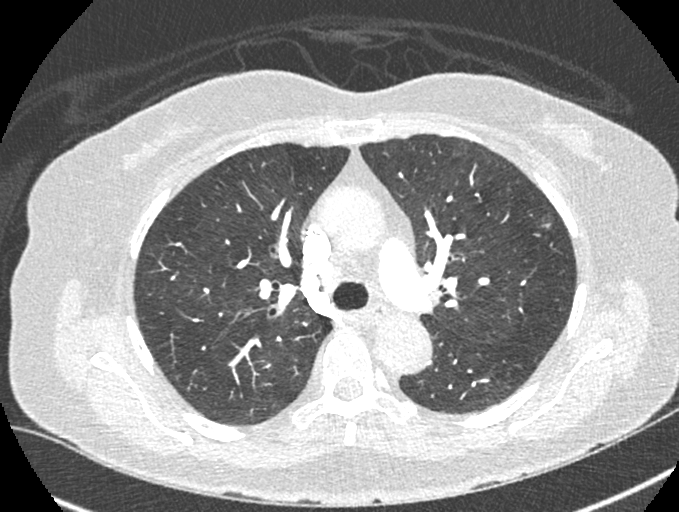
[im 193/242  mediastinal]
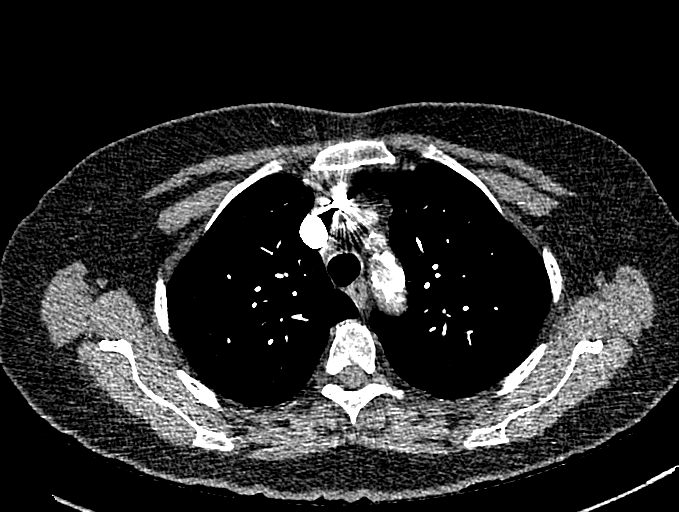
[im 217/242  lung]
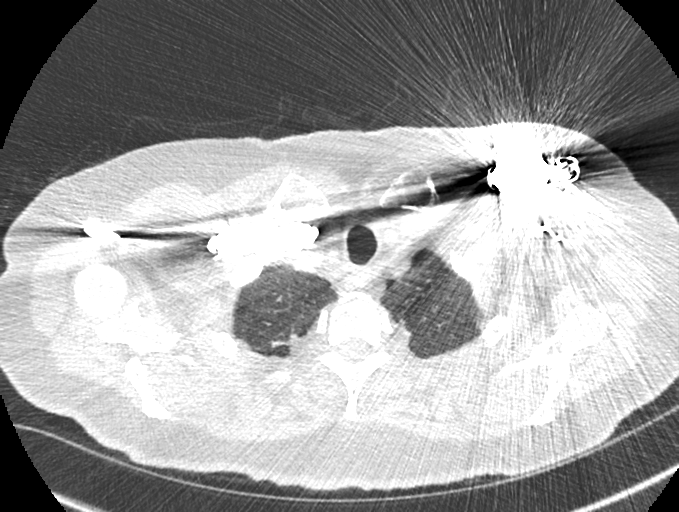

[15 of 36 positions shown; findings below may reference images not displayed]

DIAGNOSTIC STUDIES

EXAM

CT pulmonary angiogram.

INDICATION

cp hi dimer
PT STATES HAS MID CHEST DISCOMFORT. ELEVATED D-DIMER. HX OF RECENT COVID. GFR 37. 50ML [id number].
CT/NM 0/0. TJ

TECHNIQUE

All CT scans at this facility use dose modulation, iterative reconstruction, and/or weight based
dosing when appropriate to reduce radiation dose to as low as reasonably achievable.

Number of previous computed tomography exams in the last 12 months is 0 .

Number of previous nuclear medicine myocardial perfusion studies in the last 12 months is 0 .

COMPARISONS

None available

FINDINGS

No thoracic adenopathy is seen. There is mild cardiomegaly and calcification of the coronary
vessels. Cholelithiasis is noted. No filling defects are seen to indicate pulmonary emboli.

Tiny 2 millimeter left upper lobe pulmonary nodule is evident image 30 series 4. This likely is
incidental. Mild scarring and atelectasis is evident throughout the lungs. Small right middle lobe
nodule is seen image 84 series 4.

No concerning osseous lesions are seen.

IMPRESSION

No pulmonary embolism. There is cholelithiasis and calcification of the coronary vessels.

Tiny pulmonary nodules which likely are incidental. If there are significant pulmonary risk
factors, repeat CT chest in 1 year is recommended. Report called to Dr. Kalid at [DATE] p.m..

Tech Notes:

PT STATES HAS MID CHEST DISCOMFORT. ELEVATED D-DIMER. HX OF RECENT COVID. GFR 37. 50ML [id number].
CT/NM 0/0. TJ

## 2022-01-02 ENCOUNTER — Encounter: Admit: 2022-01-02 | Discharge: 2022-01-02 | Payer: MEDICARE | Primary: Family

## 2022-01-02 DIAGNOSIS — I2510 Atherosclerotic heart disease of native coronary artery without angina pectoris: Principal | ICD-10-CM

## 2022-01-02 DIAGNOSIS — E782 Mixed hyperlipidemia: Secondary | ICD-10-CM

## 2022-01-02 DIAGNOSIS — I10 Essential (primary) hypertension: Secondary | ICD-10-CM

## 2022-01-02 DIAGNOSIS — I340 Nonrheumatic mitral (valve) insufficiency: Secondary | ICD-10-CM

## 2022-01-02 DIAGNOSIS — I255 Ischemic cardiomyopathy: Secondary | ICD-10-CM

## 2022-01-02 MED ORDER — ATORVASTATIN 40 MG PO TAB
ORAL_TABLET | Freq: Every evening | 3 refills | Status: AC
Start: 2022-01-02 — End: ?

## 2022-01-06 ENCOUNTER — Encounter: Admit: 2022-01-06 | Discharge: 2022-01-06 | Payer: MEDICARE | Primary: Family

## 2022-01-06 DIAGNOSIS — I340 Nonrheumatic mitral (valve) insufficiency: Secondary | ICD-10-CM

## 2022-01-06 DIAGNOSIS — E782 Mixed hyperlipidemia: Secondary | ICD-10-CM

## 2022-01-06 DIAGNOSIS — I2510 Atherosclerotic heart disease of native coronary artery without angina pectoris: Secondary | ICD-10-CM

## 2022-01-06 DIAGNOSIS — I10 Essential (primary) hypertension: Secondary | ICD-10-CM

## 2022-01-06 DIAGNOSIS — I255 Ischemic cardiomyopathy: Secondary | ICD-10-CM

## 2022-01-15 ENCOUNTER — Encounter: Admit: 2022-01-15 | Discharge: 2022-01-15 | Payer: MEDICARE | Primary: Family

## 2022-01-15 NOTE — Telephone Encounter
Patient was scheduled for a [employer]link on 01/09/22 that has not been received. Patient was instructed to send a manual transmission. Instructed if the transmitter does not appear to be working properly, they need to contact the device company directly. Patient was provided with that contact number. Requested the patient send [name] a MyChart message or contact our device nurses at [phone] to let us know after they have sent their transmission. LVM for pt to send in a transission. BC      Note: Patient needs to send a manual transmission as we did not receive their scheduled remote interrogation.

## 2022-01-16 ENCOUNTER — Encounter: Admit: 2022-01-16 | Discharge: 2022-01-16 | Payer: MEDICARE | Primary: Family

## 2022-01-16 NOTE — Telephone Encounter
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  many appear to show parox AT w/1:1 A/V but some show true NSVT  there are episodes starting below detection rate and longer than diagnosed by device    2/13 @ 2058 for ~10sec showing NSVT/JT w/VA conduction ~105-115bpm     longest appears to be 01/06/22 @ 0228 appears to show ~15sec of NSVT starting at rates as low as ~105bpm accelerating tp peak rate of ~133bpm prior to spontaneous termination    VT monitor zone starts at 113bpm w/therapy starting at 120bpm.  Please review and f/u as needed  Thanks,  -Kaylee/Device Team

## 2022-01-16 NOTE — Telephone Encounter
Called and spoke to patient regarding any symptoms. Patient states that she can feel the "extra beats" but they are not associated with any other symptoms. She denies any chest pain, shortness of breath, or fatigue. Patient states that she is very active at home and is able to do all of her daily household chores without issues.

## 2022-04-09 ENCOUNTER — Encounter: Admit: 2022-04-09 | Discharge: 2022-04-09 | Payer: MEDICARE | Primary: Family

## 2022-04-09 DIAGNOSIS — I2510 Atherosclerotic heart disease of native coronary artery without angina pectoris: Secondary | ICD-10-CM

## 2022-04-09 DIAGNOSIS — I340 Nonrheumatic mitral (valve) insufficiency: Secondary | ICD-10-CM

## 2022-04-09 DIAGNOSIS — I071 Rheumatic tricuspid insufficiency: Secondary | ICD-10-CM

## 2022-04-09 DIAGNOSIS — I499 Cardiac arrhythmia, unspecified: Secondary | ICD-10-CM

## 2022-04-09 DIAGNOSIS — E782 Mixed hyperlipidemia: Secondary | ICD-10-CM

## 2022-04-09 DIAGNOSIS — I10 Essential (primary) hypertension: Secondary | ICD-10-CM

## 2022-04-09 DIAGNOSIS — E785 Hyperlipidemia, unspecified: Secondary | ICD-10-CM

## 2022-04-09 DIAGNOSIS — I5189 Other ill-defined heart diseases: Secondary | ICD-10-CM

## 2022-04-09 DIAGNOSIS — I2109 ST elevation (STEMI) myocardial infarction involving other coronary artery of anterior wall: Secondary | ICD-10-CM

## 2022-04-09 DIAGNOSIS — I255 Ischemic cardiomyopathy: Secondary | ICD-10-CM

## 2022-04-09 DIAGNOSIS — Z95810 Presence of automatic (implantable) cardiac defibrillator: Secondary | ICD-10-CM

## 2022-04-09 NOTE — Assessment & Plan Note
Lab Results   Component Value Date    CHOL 172 12/10/2021    TRIG 245 (H) 12/10/2021    HDL 34 (L) 12/10/2021    LDL 89 12/10/2021    VLDL 49 (H) 12/10/2021    NONHDLCHOL 103 01/08/2018    CHOLHDLC 5 12/10/2021      For some reason her last LDL was a little bit higher than it has been running.  I did not make any changes in her statin dosage today.

## 2022-04-09 NOTE — Progress Notes
Date of Service: 04/09/2022    Grace Wright is a 86 y.o. female.       HPI      Grace Wright was in the [place] clinic today for follow-up regarding coronary disease, ischemic cardiomyopathy, and ICD.  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  Grace Wright stays busy cooking for her family and helping out with other relatives.  Grace Wright is pretty upset currently because her son, JD, was recently diagnosed with prostate cancer.    I think Grace Wright is doing fine from a cardiovascular standpoint.  Grace Wright denies any problems with chest discomfort or breathlessness.  Grace Wright has a little bit of peripheral edema at times but no problems with orthopnea.  Grace Wright has not had any palpitations or ICD shocks.  Grace Wright denies any TIA or stroke symptoms.         Vitals:    04/09/22 1533   BP: (!) 142/82   BP Source: Arm, Left Upper   Pulse: 86   SpO2: 97%   O2 Device: None (Room air)   PainSc: Zero   Weight: 60.8 kg (134 lb)   Height: 157.5 cm (5' 2)     Body mass index is 24.51 kg/m?Marland Kitchen     Past Medical History  Patient Active Problem List    Diagnosis Date Noted   ? Preop cardiovascular exam 03/24/2018   ? Carotid artery bruit 01/14/2017     02/09/17 Carotid duplex at [facility]: Mild carotid bulb plaque bilaterally extending of the prox bilateral internal carotid arteries. No hemodynamically significant stenosis.     ? Automatic implantable cardioverter-defibrillator in situ 03/25/2010   ? Coronary artery disease 09/03/2009     5/86 - Anterior MI.      1987 - RCA and LAD angioplasty.      7/99 - Exercise echo: EF 30%, non-ischemic.      7/00 - Gated bruce thallium: no active inducible ischemia.      7/01 - Exercise echo: non-ischemic.  01/07/2018 cath - DES x 1 to LAD      ? Ischemic cardiomyopathy 09/03/2009      7/99 - EF 30% per echo.       7/00 - EF 37% per thallium.       7/01 - EF 25% per echo     ? Hyperlipidemia 09/03/2009     1997- Treatment with Zocor.       7/01 - Switched to Lipitor d/t prescription formulary.       2010 - Switched to Crestor for prescription formulary     ? Hypertension 09/03/2009   ? Mitral regurgitation 09/03/2009   ? Tricuspid regurgitation 09/03/2009   ? Diastolic dysfunction 09/03/2009     Mild     ? Acute MI anterior wall first episode care [facility]) 09/04/1986     S/P PTCA LAD, S/P PTCA RCA           Review of Systems   Constitutional: Negative.   HENT: Negative.    Eyes: Negative.    Cardiovascular: Negative.    Respiratory: Negative.    Endocrine: Negative.    Hematologic/Lymphatic: Negative.    Skin: Negative.    Musculoskeletal: Negative.    Gastrointestinal: Negative.    Genitourinary: Negative.    Neurological: Negative.    Psychiatric/Behavioral: Negative.    Allergic/Immunologic: Negative.        Physical Exam  Physical Exam   General Appearance: no distress   Skin: warm, no ulcers or xanthomas   Digits and Nails: no cyanosis or clubbing   Eyes: conjunctivae and lids normal, pupils are equal and round   Teeth/Gums/Palate: dentition unremarkable, no lesions   Lips & Oral Mucosa: no pallor or cyanosis   Neck Veins: normal JVP , neck veins are not distended   Thyroid: no nodules, masses, tenderness or enlargement   Chest Inspection: chest is normal in appearance   Respiratory Effort: breathing comfortably, no respiratory distress   Auscultation/Percussion: lungs clear to auscultation, no rales or rhonchi, no wheezing   PMI: PMI not enlarged or displaced   Cardiac Rhythm: regular rhythm and normal rate   Cardiac Auscultation: S1, S2 normal, no rub, no gallop   Murmurs: no murmur   Peripheral Circulation: normal peripheral circulation   Carotid Arteries: normal carotid upstroke bilaterally, no bruits   Radial Arteries: normal symmetric radial pulses   Abdominal Aorta: no abdominal aortic bruit   Pedal Pulses: normal symmetric pedal pulses   Lower Extremity Edema: no lower extremity edema   Abdominal Exam: soft, non-tender, no masses, bowel sounds normal Liver & Spleen: no organomegaly   Gait & Station: walks without assistance   Muscle Strength: normal muscle tone   Orientation: oriented to time, place and person   Affect & Mood: appropriate and sustained affect   Language and Memory: patient responsive and seems to comprehend information   Neurologic Exam: neurological assessment grossly intact   Other: moves all extremities      Cardiovascular Health Factors  Vitals BP Readings from Last 3 Encounters:   04/09/22 (!) 142/82   09/11/21 (!) 150/84   03/13/21 (!) 156/88     Wt Readings from Last 3 Encounters:   04/09/22 60.8 kg (134 lb)   09/11/21 61.7 kg (136 lb)   03/13/21 60.8 kg (134 lb)     BMI Readings from Last 3 Encounters:   04/09/22 24.51 kg/m?   09/11/21 24.87 kg/m?   03/13/21 24.51 kg/m?      Smoking Social History     Tobacco Use   Smoking Status Former   ? Packs/day: 0.50   ? Years: 20.00   ? Pack years: 10.00   ? Types: Cigarettes   ? Quit date: 11/30/1984   ? Years since quitting: 37.3   Smokeless Tobacco Never      Lipid Profile Cholesterol   Date Value Ref Range Status   12/10/2021 172  Final     HDL   Date Value Ref Range Status   12/10/2021 34 (L) >=40 Final     LDL   Date Value Ref Range Status   12/10/2021 89  Final     Triglycerides   Date Value Ref Range Status   12/10/2021 245 (H) <150 Final      Blood Sugar Hemoglobin A1C   Date Value Ref Range Status   12/25/2016 5.8  Final     Glucose   Date Value Ref Range Status   12/10/2021 111 (H) 70 - 105 Final   11/13/2020 108 (H) 70 - 105 Final   11/17/2019 104  Final   06/27/2003 105 70 - 110 MG/DL Final          Problems Addressed Today  Encounter Diagnoses   Name Primary?   ? Coronary artery disease involving native coronary artery of native heart without angina pectoris    ? Mixed hyperlipidemia    ? Primary hypertension    ?  Automatic implantable cardioverter-defibrillator in situ        Assessment and Plan       Coronary artery disease  We have not done a stress test for about 5 years but Grace Wright has not had any symptoms to suggest progression and her symptoms of been a pretty reliable indicator in the past.    Hyperlipidemia  Lab Results   Component Value Date    CHOL 172 12/10/2021    TRIG 245 (H) 12/10/2021    HDL 34 (L) 12/10/2021    LDL 89 12/10/2021    VLDL 49 (H) 12/10/2021    NONHDLCHOL 103 01/08/2018    CHOLHDLC 5 12/10/2021      For some reason her last LDL was a little bit higher than it has been running.  I did not make any changes in her statin dosage today.    Hypertension  Her blood pressure is in an acceptable range when Grace Wright checks it at home, running about 140/80 or less.    Automatic implantable cardioverter-defibrillator in situ  Her last device check was in February.  Grace Wright always has runs of nonsustained ventricular tachycardia but nothing long enough to be treated.  Her device is functioning normally.      Current Medications (including today's revisions)  ? acetaminophen (TYLENOL EXTRA STRENGTH) 500 mg tablet Take one tablet by mouth every 6 hours as needed for Pain.   ? amoxicillin-potassium clavulanate (AUGMENTIN) 875/125 mg tablet    ? aspirin EC 81 mg tablet Take one tablet by mouth daily.   ? atorvastatin (LIPITOR) 40 mg tablet TAKE 1 TABLET BY MOUTH EVERY NIGHT AT BEDTIME   ? busPIRone (BUSPAR) 15 mg tablet Take one-half tablet by mouth twice daily.   ? carvediloL (COREG) 25 mg tablet Take one tablet by mouth twice daily with meals.   ? hydroCHLOROthiazide 12.5 mg tablet TAKE ONE TABLET BY MOUTH EVERY MORNING   ? latanoprost (XALATAN) 0.005 % ophthalmic solution Place one drop into or around eye(s) at bedtime daily.   ? losartan (COZAAR) 50 mg tablet Take one tablet by mouth daily.   ? pantoprazole DR (PROTONIX) 40 mg tablet Take one tablet by mouth twice daily.   ? traZODone (DESYREL) 50 mg tablet Take one tablet by mouth at bedtime daily.     Total time spent on today's office visit was 30 minutes.  This includes face-to-face in person visit with patient as well as nonface-to-face time including review of the EMR, outside records, labs, radiologic studies, echocardiogram & other cardiovascular studies, formation of treatment plan, after visit summary, future disposition, and lastly on documentation.

## 2022-04-09 NOTE — Assessment & Plan Note
Her blood pressure is in an acceptable range when she checks it at home, running about 140/80 or less.

## 2022-04-09 NOTE — Assessment & Plan Note
We have not done a stress test for about 5 years but she has not had any symptoms to suggest progression and her symptoms of been a pretty reliable indicator in the past.

## 2022-04-09 NOTE — Assessment & Plan Note
Her last device check was in February.  She always has runs of nonsustained ventricular tachycardia but nothing long enough to be treated.  Her device is functioning normally.

## 2022-04-22 ENCOUNTER — Encounter: Admit: 2022-04-22 | Discharge: 2022-04-22 | Payer: MEDICARE | Primary: Family

## 2022-04-22 DIAGNOSIS — Z95810 Presence of automatic (implantable) cardiac defibrillator: Secondary | ICD-10-CM

## 2022-04-23 ENCOUNTER — Encounter: Admit: 2022-04-23 | Discharge: 2022-04-23 | Payer: MEDICARE | Primary: Family

## 2022-04-23 NOTE — Telephone Encounter
-----   Message from Rich Fuchs, RN sent at 04/22/2022  4:41 PM CDT -----  Regarding: FW: 2 min AFLUtter in March. No OAC.    ----- Message -----  From: Anner Crete, RN  Sent: 04/22/2022   3:53 PM CDT  To: Cvm Nurse Gen Card Team Red  Subject: 2 min AFLUtter in March. No OAC.                 See remote

## 2022-04-23 NOTE — Telephone Encounter
Remote indicated aflutter in march 2 min on no OAC.  Patient denies s/s states she is doing fine.  Will route to [facility] for review and recommendations.

## 2022-04-28 ENCOUNTER — Encounter: Admit: 2022-04-28 | Discharge: 2022-04-28 | Payer: MEDICARE | Primary: Family

## 2022-04-28 NOTE — Telephone Encounter
-----   Message from Vanice Sarah, MD sent at 04/23/2022  3:50 PM CDT -----  Regarding: RE: 2 min AFLUtter in March. No OAC.  No changes, thanks.  ----- Message -----  From: Weston Brass  Sent: 04/23/2022  11:18 AM CDT  To: Vanice Sarah, MD  Subject: FW: 2 min AFLUtter in March. No OAC.             Abnormal remote.  Pt denies issues.  Recent OV. Recommendations?  Thanks  Brett Canales  ----- Message -----  From: Rich Fuchs, RN  Sent: 04/22/2022   4:43 PM CDT  To: Cvm Nurse [place]  Subject: FW: 2 min AFLUtter in March. No OAC.               ----- Message -----  From: Anner Crete, RN  Sent: 04/22/2022   3:53 PM CDT  To: Cvm Nurse Gen Card Team Red  Subject: 2 min AFLUtter in March. No OAC.                 See remote

## 2022-06-13 ENCOUNTER — Encounter: Admit: 2022-06-13 | Discharge: 2022-06-13 | Payer: MEDICARE | Primary: Family

## 2022-07-03 ENCOUNTER — Encounter: Admit: 2022-07-03 | Discharge: 2022-07-03 | Payer: MEDICARE | Primary: Family

## 2022-07-03 MED ORDER — HYDROCHLOROTHIAZIDE 12.5 MG PO TAB
12.5 mg | ORAL_TABLET | Freq: Every morning | ORAL | 3 refills | 30.00000 days | Status: AC
Start: 2022-07-03 — End: ?

## 2022-07-21 ENCOUNTER — Encounter: Admit: 2022-07-21 | Discharge: 2022-07-21 | Payer: MEDICARE | Primary: Family

## 2022-08-13 ENCOUNTER — Encounter: Admit: 2022-08-13 | Discharge: 2022-08-13 | Payer: MEDICARE | Primary: Family

## 2022-08-13 ENCOUNTER — Ambulatory Visit: Admit: 2022-08-13 | Discharge: 2022-08-13 | Payer: MEDICARE | Primary: Family

## 2022-08-13 DIAGNOSIS — Z95810 Presence of automatic (implantable) cardiac defibrillator: Secondary | ICD-10-CM

## 2022-08-14 ENCOUNTER — Encounter: Admit: 2022-08-14 | Discharge: 2022-08-14 | Payer: MEDICARE | Primary: Family

## 2022-08-14 DIAGNOSIS — Z95810 Presence of automatic (implantable) cardiac defibrillator: Secondary | ICD-10-CM

## 2022-10-21 ENCOUNTER — Encounter: Admit: 2022-10-21 | Discharge: 2022-10-21 | Payer: MEDICARE | Primary: Family

## 2022-11-19 ENCOUNTER — Encounter: Admit: 2022-11-19 | Discharge: 2022-11-19 | Payer: MEDICARE | Primary: Family

## 2022-11-19 ENCOUNTER — Ambulatory Visit: Admit: 2022-11-19 | Discharge: 2022-11-19 | Payer: MEDICARE | Primary: Family

## 2022-11-19 DIAGNOSIS — I2510 Atherosclerotic heart disease of native coronary artery without angina pectoris: Secondary | ICD-10-CM

## 2022-11-19 DIAGNOSIS — I5189 Other ill-defined heart diseases: Secondary | ICD-10-CM

## 2022-11-19 DIAGNOSIS — I2109 ST elevation (STEMI) myocardial infarction involving other coronary artery of anterior wall: Secondary | ICD-10-CM

## 2022-11-19 DIAGNOSIS — I255 Ischemic cardiomyopathy: Secondary | ICD-10-CM

## 2022-11-19 DIAGNOSIS — Z95810 Presence of automatic (implantable) cardiac defibrillator: Secondary | ICD-10-CM

## 2022-11-19 DIAGNOSIS — I10 Essential (primary) hypertension: Secondary | ICD-10-CM

## 2022-11-19 DIAGNOSIS — E782 Mixed hyperlipidemia: Secondary | ICD-10-CM

## 2022-11-19 DIAGNOSIS — E785 Hyperlipidemia, unspecified: Secondary | ICD-10-CM

## 2022-11-19 DIAGNOSIS — I499 Cardiac arrhythmia, unspecified: Secondary | ICD-10-CM

## 2022-11-19 DIAGNOSIS — I071 Rheumatic tricuspid insufficiency: Secondary | ICD-10-CM

## 2022-11-19 DIAGNOSIS — I340 Nonrheumatic mitral (valve) insufficiency: Secondary | ICD-10-CM

## 2022-11-19 LAB — BASIC METABOLIC PANEL
BLD UREA NITROGEN: 19
CALCIUM: 9.3
CHLORIDE: 108 — ABNORMAL HIGH (ref 98–107)
CO2: 23
CREATININE: 0.9
GFR ESTIMATED: 63
GLUCOSE,PANEL: 108 — ABNORMAL HIGH (ref 70–105)
POTASSIUM: 3.8
SODIUM: 140

## 2022-11-19 LAB — MAGNESIUM: MAGNESIUM: 1.6

## 2022-11-19 NOTE — Assessment & Plan Note
BP looks OK.

## 2022-11-19 NOTE — Progress Notes
Date of Service: 11/19/2022    Grace Wright is a 86 y.o. female.       HPI     Grace Wright was in the [facility] clinic today for follow-up regarding coronary disease, ischemic cardiomyopathy, and ICD.  She tripped over a rug recently and landed on her left knee, sustaining some soft tissue injury but no bone fracture.  She still black and blue and sore but recovering.    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    She received a phone call from one of our office nurses recently about some increase in ventricular ectopy noted on her device check.  She has had absolutely no problems with palpitations or other symptoms.  She denies any chest discomfort or breathlessness.  She has had no peripheral edema, orthopnea, or other manifestations of fluid retention.         Vitals:    11/19/22 1408   BP: 122/62   BP Source: Arm, Left Upper   Pulse: 70   SpO2: 95%   O2 Device: None (Room air)   PainSc: Five   Weight: 59.4 kg (131 lb)   Height: 157.5 cm (5' 2)     Body mass index is 23.96 kg/m?Marland Kitchen     Past Medical History  Patient Active Problem List    Diagnosis Date Noted    Preop cardiovascular exam 03/24/2018    Carotid artery bruit 01/14/2017     02/09/17 Carotid duplex at [facility]: Mild carotid bulb plaque bilaterally extending of the prox bilateral internal carotid arteries. No hemodynamically significant stenosis.      Automatic implantable cardioverter-defibrillator in situ 03/25/2010    Coronary artery disease 09/03/2009     5/86 - Anterior MI.      1987 - RCA and LAD angioplasty.      7/99 - Exercise echo: EF 30%, non-ischemic.      7/00 - Gated bruce thallium: no active inducible ischemia.      7/01 - Exercise echo: non-ischemic.  01/07/2018 cath - DES x 1 to LAD       Ischemic cardiomyopathy 09/03/2009      7/99 - EF 30% per echo.       7/00 - EF 37% per thallium.       7/01 - EF 25% per echo      Hyperlipidemia 09/03/2009     1997- Treatment with Zocor.       7/01 - Switched to Lipitor d/t prescription formulary.       2010 - Switched to Crestor for prescription formulary      Hypertension 09/03/2009    Mitral regurgitation 09/03/2009    Tricuspid regurgitation 09/03/2009    Diastolic dysfunction 09/03/2009     Mild      Acute MI anterior wall first episode care [facility]) 09/04/1986     S/P PTCA LAD, S/P PTCA RCA           Review of Systems   Constitutional: Negative.   HENT:  Positive for tinnitus.    Eyes: Negative.    Cardiovascular:  Positive for leg swelling.   Respiratory: Negative.     Endocrine: Negative.    Hematologic/Lymphatic: Negative.    Skin: Negative.    Musculoskeletal:  Positive for back pain, falls and joint pain.   Gastrointestinal: Negative.    Genitourinary: Negative.    Neurological: Negative.  Psychiatric/Behavioral: Negative.     Allergic/Immunologic: Negative.        Physical Exam    Physical Exam   General Appearance: no distress   Skin: warm, no ulcers or xanthomas   Digits and Nails: no cyanosis or clubbing   Eyes: conjunctivae and lids normal, pupils are equal and round   Teeth/Gums/Palate: dentition unremarkable, no lesions   Lips & Oral Mucosa: no pallor or cyanosis   Neck Veins: normal JVP , neck veins are not distended   Thyroid: no nodules, masses, tenderness or enlargement   Chest Inspection: chest is normal in appearance   Respiratory Effort: breathing comfortably, no respiratory distress   Auscultation/Percussion: lungs clear to auscultation, no rales or rhonchi, no wheezing   PMI: PMI not enlarged or displaced   Cardiac Rhythm: regular rhythm and normal rate   Cardiac Auscultation: S1, S2 normal, no rub, no gallop   Murmurs: no murmur   Peripheral Circulation: normal peripheral circulation   Carotid Arteries: normal carotid upstroke bilaterally, no bruits   Radial Arteries: normal symmetric radial pulses   Abdominal Aorta: no abdominal aortic bruit   Pedal Pulses: normal symmetric pedal pulses   Lower Extremity Edema: no lower extremity edema   Abdominal Exam: soft, non-tender, no masses, bowel sounds normal   Liver & Spleen: no organomegaly   Gait & Station: walks without assistance   Muscle Strength: normal muscle tone   Orientation: oriented to time, place and person   Affect & Mood: appropriate and sustained affect   Language and Memory: patient responsive and seems to comprehend information   Neurologic Exam: neurological assessment grossly intact   Other: moves all extremities      Cardiovascular Health Factors  Vitals BP Readings from Last 3 Encounters:   11/19/22 122/62   04/09/22 (!) 142/82   09/11/21 (!) 150/84     Wt Readings from Last 3 Encounters:   11/19/22 59.4 kg (131 lb)   04/09/22 60.8 kg (134 lb)   09/11/21 61.7 kg (136 lb)     BMI Readings from Last 3 Encounters:   11/19/22 23.96 kg/m?   04/09/22 24.51 kg/m?   09/11/21 24.87 kg/m?      Smoking Social History     Tobacco Use   Smoking Status Former    Packs/day: 0.50    Years: 20.00    Additional pack years: 0.00    Total pack years: 10.00    Types: Cigarettes    Quit date: 11/30/1984    Years since quitting: 37.9   Smokeless Tobacco Never      Lipid Profile Cholesterol   Date Value Ref Range Status   12/10/2021 172  Final     HDL   Date Value Ref Range Status   12/10/2021 34 (L) >=40 Final     LDL   Date Value Ref Range Status   12/10/2021 89  Final     Triglycerides   Date Value Ref Range Status   12/10/2021 245 (H) <150 Final      Blood Sugar Hemoglobin A1C   Date Value Ref Range Status   12/25/2016 5.8  Final     Glucose   Date Value Ref Range Status   12/10/2021 111 (H) 70 - 105 Final   11/13/2020 108 (H) 70 - 105 Final   11/17/2019 104  Final   06/27/2003 105 70 - 110 MG/DL Final          Problems Addressed Today  Encounter Diagnoses   Name  Primary?    Coronary artery disease involving native coronary artery of native heart without angina pectoris Yes    Diastolic dysfunction     Mixed hyperlipidemia     Primary hypertension Automatic implantable cardioverter-defibrillator in situ        Assessment and Plan       Coronary artery disease  She is not having chest discomfort or other symptoms to suggest progression of disease.  Really the last evaluation for ischemia was an arteriogram in 2019.    Diastolic dysfunction  Her volume status is stable and she really has not required loop diuretics much at all for her diastolic dysfunction.    Hyperlipidemia  Lab Results   Component Value Date    CHOL 172 12/10/2021    TRIG 245 (H) 12/10/2021    HDL 34 (L) 12/10/2021    LDL 89 12/10/2021    VLDL 49 (H) 12/10/2021    NONHDLCHOL 103 01/08/2018    CHOLHDLC 5 12/10/2021      I ordered an updated lipid profile today.      Hypertension  BP looks OK.    Automatic implantable cardioverter-defibrillator in situ  Her device check showed a slight increase in ventricular ectopy.  She has not had labs since January so I put in an order for a basic metabolic profile and magnesium.      Current Medications (including today's revisions)   acetaminophen (TYLENOL EXTRA STRENGTH) 500 mg tablet Take one tablet by mouth every 6 hours as needed for Pain.    aspirin EC 81 mg tablet Take one tablet by mouth daily.    atorvastatin (LIPITOR) 40 mg tablet TAKE 1 TABLET BY MOUTH EVERY NIGHT AT BEDTIME    busPIRone (BUSPAR) 15 mg tablet Take one-half tablet by mouth twice daily.    carvediloL (COREG) 25 mg tablet Take one tablet by mouth twice daily with meals.    dorzolamide-timoloL (COSOPT) 2-0.5 % ophthalmic solution Apply one drop to both eyes twice daily.    hydroCHLOROthiazide 12.5 mg tablet TAKE ONE TABLET BY MOUTH EVERY MORNING    latanoprost (XALATAN) 0.005 % ophthalmic solution Place one drop into or around eye(s) at bedtime daily.    losartan (COZAAR) 50 mg tablet Take one tablet by mouth daily.    pantoprazole DR (PROTONIX) 40 mg tablet Take one tablet by mouth twice daily.    traZODone (DESYREL) 50 mg tablet Take one tablet by mouth at bedtime daily. Total time spent on today's office visit was 30 minutes.  This includes face-to-face in person visit with patient as well as nonface-to-face time including review of the EMR, outside records, labs, radiologic studies, echocardiogram & other cardiovascular studies, formation of treatment plan, after visit summary, future disposition, and lastly on documentation.

## 2022-11-19 NOTE — Assessment & Plan Note
Her device check showed a slight increase in ventricular ectopy.  She has not had labs since January so I put in an order for a basic metabolic profile and magnesium.

## 2022-11-19 NOTE — Assessment & Plan Note
Her volume status is stable and she really has not required loop diuretics much at all for her diastolic dysfunction.

## 2022-11-19 NOTE — Assessment & Plan Note
She is not having chest discomfort or other symptoms to suggest progression of disease.  Really the last evaluation for ischemia was an arteriogram in 2019.

## 2022-11-24 ENCOUNTER — Encounter: Admit: 2022-11-24 | Discharge: 2022-11-24 | Payer: MEDICARE | Primary: Family

## 2022-12-26 ENCOUNTER — Encounter: Admit: 2022-12-26 | Discharge: 2022-12-26 | Payer: MEDICARE | Primary: Family

## 2022-12-26 DIAGNOSIS — I2510 Atherosclerotic heart disease of native coronary artery without angina pectoris: Secondary | ICD-10-CM

## 2022-12-26 MED ORDER — ATORVASTATIN 40 MG PO TAB
ORAL_TABLET | 3 refills
Start: 2022-12-26 — End: ?

## 2023-01-20 ENCOUNTER — Encounter: Admit: 2023-01-20 | Discharge: 2023-01-20 | Payer: MEDICARE | Primary: Family

## 2023-01-21 ENCOUNTER — Encounter: Admit: 2023-01-21 | Discharge: 2023-01-21 | Payer: MEDICARE | Primary: Family

## 2023-01-21 NOTE — Telephone Encounter
-----   Message from Vickie Epley, RN sent at 01/21/2023  7:13 AM CST -----  Regarding: FW: 2.5 min AFL on remote. No OAC. Noted last year as well.    ----- Message -----  From: Lillie Fragmin, RN  Sent: 01/20/2023   5:32 PM CST  To: Cvm Nurse Gen Card Team Red  Subject: 2.5 min AFL on remote. No OAC. Noted last ye#    See remote for  more information.

## 2023-01-21 NOTE — Telephone Encounter
Report of possible 2.5 min of AFL on remote.  Called patient no symptoms states she has been doing well will forward to [facility] for review and recommendations.

## 2023-04-19 ENCOUNTER — Encounter: Admit: 2023-04-19 | Discharge: 2023-04-19 | Payer: MEDICARE | Primary: Family

## 2023-04-19 DIAGNOSIS — I255 Ischemic cardiomyopathy: Secondary | ICD-10-CM

## 2023-04-19 DIAGNOSIS — I2510 Atherosclerotic heart disease of native coronary artery without angina pectoris: Secondary | ICD-10-CM

## 2023-04-19 DIAGNOSIS — Z95810 Presence of automatic (implantable) cardiac defibrillator: Secondary | ICD-10-CM

## 2023-04-21 ENCOUNTER — Encounter: Admit: 2023-04-21 | Discharge: 2023-04-21 | Payer: MEDICARE | Primary: Family

## 2023-04-22 ENCOUNTER — Encounter: Admit: 2023-04-22 | Discharge: 2023-04-22 | Payer: MEDICARE | Primary: Family

## 2023-04-22 MED ORDER — FUROSEMIDE 20 MG PO TAB
20 mg | ORAL_TABLET | Freq: Every morning | ORAL | 1 refills | 90.00000 days | Status: AC
Start: 2023-04-22 — End: ?

## 2023-04-22 MED ORDER — POTASSIUM CHLORIDE 20 MEQ PO TBTQ
20 meq | ORAL_TABLET | Freq: Every day | ORAL | 1 refills | 30.00000 days | Status: AC
Start: 2023-04-22 — End: ?

## 2023-04-22 NOTE — Telephone Encounter
Called and discussed with Grace Wright.  She states that overall she is feeling okay.  She has noticed that  when she is up doing things, she gets out of air quickly.  She states she feels like she doesn't have enough air and takes a minute to catch her breath.  She denies any palpitations or chest pain.  She denies any swelling or any other issues.    [name] confirmed hat she is taking her medications as prescribed including Coreg 25mg  BID.      Will route to Dr. Barry Dienes for review and recommendations.

## 2023-04-22 NOTE — Telephone Encounter
Grace Sarah, MD  Rogelia Boga, RN  Caller: Unspecified (Today,  1:17 PM)  Can we have her switch from HCTZ to furosemide 20 mg/day.  She should also add potassium chloride 20 mEq daily.  Can you then call her in 7-10 days to see if the diuretic is helping with symptoms?  Thanks.    Called and discussed with [name].  She is agreeable to plan.  Med list updated.  New scripts sent to pharmacy.  Pt will monitor her BP and call us with any problems.  Will follow up with [name] in 7-10 days to check on symptoms.

## 2023-04-22 NOTE — Telephone Encounter
-----   Message from Etta Grandchild sent at 04/21/2023  3:28 PM CDT -----  Regarding: SDO-PVC burden  Scheduled remote on 04/16/23 showed a very big jump in PVC burden since 01/15/23.  772.4/hr (was 242.5), 16.2 runs/hr (was 6.9).  Note last lab K 3.8, Mg 1.6.   Her presenting showed freq PJCs and 13 NSVT events that look like brief AT, up to 10 sec.  Optivol hasn't crossed threshold but trending toward possible HF.  Pls f/u as needed.

## 2023-04-23 ENCOUNTER — Encounter: Admit: 2023-04-23 | Discharge: 2023-04-23 | Payer: MEDICARE | Primary: Family

## 2023-04-23 DIAGNOSIS — I255 Ischemic cardiomyopathy: Secondary | ICD-10-CM

## 2023-04-23 DIAGNOSIS — I5189 Other ill-defined heart diseases: Secondary | ICD-10-CM

## 2023-04-23 DIAGNOSIS — Z95810 Presence of automatic (implantable) cardiac defibrillator: Secondary | ICD-10-CM

## 2023-05-11 ENCOUNTER — Encounter: Admit: 2023-05-11 | Discharge: 2023-05-11 | Payer: MEDICARE | Primary: Family

## 2023-05-11 NOTE — Telephone Encounter
Called lmom

## 2023-05-11 NOTE — Telephone Encounter
-----   Message from [place] C sent at 05/05/2023  3:37 PM CDT -----  Regarding: Follow up on symptoms  I have a phone note started about an abnormal device check, but have been working with [facility] and this patient.  On 6/5, SDO decreased her Coreg to 12.5mg  BID as she reported that she was feeling tired in the mornings and took awhile to get going in the afternoon.  She is going to continue to monitor her BP/HR/symptoms - he also recently changed her diuretic due to DOE.  Please check in with Tr[place]i to see how she is doing and follow up with [facility].    Thank you!

## 2023-07-02 ENCOUNTER — Encounter: Admit: 2023-07-02 | Discharge: 2023-07-02 | Payer: MEDICARE

## 2023-07-02 MED ORDER — HYDROCHLOROTHIAZIDE 12.5 MG PO TAB
12.5 mg | ORAL_TABLET | Freq: Every morning | ORAL | 3 refills | 30.00000 days | Status: AC
Start: 2023-07-02 — End: ?

## 2023-07-22 ENCOUNTER — Encounter: Admit: 2023-07-22 | Discharge: 2023-07-22 | Payer: MEDICARE

## 2023-07-27 ENCOUNTER — Encounter: Admit: 2023-07-27 | Discharge: 2023-07-27 | Payer: MEDICARE

## 2023-07-27 NOTE — Telephone Encounter
-----   Message from [place] H sent at 07/22/2023  2:28 PM CDT -----  Regarding: FW: SDO Fluid accumulation    ----- Message -----  From: Ninfa Meeker  Sent: 07/22/2023   2:23 PM CDT  To: Cvm Nurse Gen Card Team Red  Subject: SDO Fluid accumulation                           Remote transmission from 07/16/23 presents with elevated fluid status per Optivol and Thoracic impedance trends.  Please review remote from this date.  Complete report sent to chart for further detail/review.  Follow up as needed.      Thank you,  Terri/Device Team

## 2023-07-27 NOTE — Telephone Encounter
Called and discussed with Grace Wright.  She states that she occasionally will have some swelling in her left leg.  She states that she had a bad fall awhile ago and was told she would probably always get some swelling in that leg.  She states when it happens, she elevates her leg and it goes away.  She denies any weight gain or shortness of breath.  She is taking her medications as prescribed.    Will route to Dr. Barry Dienes for review and recommendations.

## 2023-08-17 ENCOUNTER — Encounter: Admit: 2023-08-17 | Discharge: 2023-08-17 | Payer: MEDICARE

## 2023-08-17 ENCOUNTER — Ambulatory Visit: Admit: 2023-08-17 | Discharge: 2023-08-17 | Payer: MEDICARE

## 2023-08-17 DIAGNOSIS — Z95810 Presence of automatic (implantable) cardiac defibrillator: Secondary | ICD-10-CM

## 2023-10-16 ENCOUNTER — Ambulatory Visit: Admit: 2023-10-16 | Discharge: 2023-10-17 | Payer: MEDICARE

## 2023-12-16 ENCOUNTER — Encounter: Admit: 2023-12-16 | Discharge: 2023-12-16 | Payer: MEDICARE

## 2023-12-16 ENCOUNTER — Ambulatory Visit: Admit: 2023-12-16 | Discharge: 2023-12-16 | Payer: MEDICARE

## 2023-12-16 VITALS — BP 139/88 | HR 78 | Ht 62.0 in | Wt 123.6 lb

## 2023-12-16 DIAGNOSIS — I10 Essential (primary) hypertension: Secondary | ICD-10-CM

## 2023-12-16 DIAGNOSIS — Z95810 Presence of automatic (implantable) cardiac defibrillator: Secondary | ICD-10-CM

## 2023-12-16 DIAGNOSIS — I2510 Atherosclerotic heart disease of native coronary artery without angina pectoris: Principal | ICD-10-CM

## 2023-12-16 DIAGNOSIS — I255 Ischemic cardiomyopathy: Secondary | ICD-10-CM

## 2023-12-16 DIAGNOSIS — E782 Mixed hyperlipidemia: Secondary | ICD-10-CM

## 2023-12-16 NOTE — Assessment & Plan Note
Lab Results   Component Value Date    CHOL 172 12/10/2021    TRIG 245 (H) 12/10/2021    HDL 34 (L) 12/10/2021    LDL 89 12/10/2021    VLDL 49 (H) 12/10/2021    NONHDLCHOL 103 01/08/2018    CHOLHDLC 5 12/10/2021      LDL goal < 70.  Atorva 40/day.

## 2023-12-16 NOTE — Assessment & Plan Note
Anterior MI in 1986 was initial manifestation of CAD--POBA to LAD and RCA.  Cath in 2019 after presenting with nausea, diaphoresis (symptoms similar to MI) with LAD stent.    Last assessment was the arteriography in 2019.  I'm not planning surveillance stress tests unless she has worrisome symptoms.

## 2023-12-16 NOTE — Progress Notes
Date of Service: 12/16/2023    Grace Wright is a 88 y.o. female.       HPI     Grace Wright was in the [facility] today for follow-up regarding coronary disease, ischemic cardiomyopathy, and ICD.  I hadn't seen her for a year, but she really looks better--she's lost about 10 pounds.  She denies any trouble with chest discomfort or palpitations.  She knows that if she gets in too much of a hurry she'll have to stop to catch her breath.  She's adjusted to this, she says.     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     She has had absolutely no problems with palpitations or other symptoms.  She denies any chest discomfort or breathlessness.  She has had no peripheral edema, orthopnea, or other manifestations of fluid retention.            Vitals:    12/16/23 1416   BP: 139/88   BP Source: Arm, Left Upper   Pulse: 78   SpO2: 96%   O2 Device: None (Room air)   PainSc: Zero   Weight: 56.1 kg (123 lb 9.6 oz)   Height: 157.5 cm (5' 2)     Body mass index is 22.61 kg/m?Marland Kitchen     Past Medical History  Patient Active Problem List    Diagnosis Date Noted    Preop cardiovascular exam 03/24/2018    Carotid artery bruit 01/14/2017     02/09/17 Carotid duplex at [facility]: Mild carotid bulb plaque bilaterally extending of the prox bilateral internal carotid arteries. No hemodynamically significant stenosis.      Automatic implantable cardioverter-defibrillator in situ 03/25/2010    Coronary artery disease 09/03/2009     5/86 - Anterior MI.      1987 - RCA and LAD angioplasty.      7/99 - Exercise echo: EF 30%, non-ischemic.      7/00 - Gated bruce thallium: no active inducible ischemia.      7/01 - Exercise echo: non-ischemic.  01/07/2018 cath - DES x 1 to LAD       Ischemic cardiomyopathy 09/03/2009      7/99 - EF 30% per echo.       7/00 - EF 37% per thallium.       7/01 - EF 25% per echo      Hyperlipidemia 09/03/2009 1997- Treatment with Zocor.       7/01 - Switched to Lipitor d/t prescription formulary.       2010 - Switched to Crestor for prescription formulary      Hypertension 09/03/2009    Mitral regurgitation 09/03/2009    Tricuspid regurgitation 09/03/2009    Diastolic dysfunction 09/03/2009     Mild           Review of Systems   Constitutional: Negative.   HENT: Negative.     Eyes: Negative.    Cardiovascular: Negative.    Respiratory: Negative.     Endocrine: Negative.    Hematologic/Lymphatic: Negative.    Skin: Negative.    Musculoskeletal: Negative.    Gastrointestinal: Negative.    Genitourinary: Negative.    Neurological: Negative.    Psychiatric/Behavioral: Negative.     Allergic/Immunologic: Negative.        Physical Exam    Physical Exam   General Appearance: no distress   Skin: warm, no  ulcers or xanthomas   Digits and Nails: no cyanosis or clubbing   Eyes: conjunctivae and lids normal, pupils are equal and round   Teeth/Gums/Palate: dentition unremarkable, no lesions   Lips & Oral Mucosa: no pallor or cyanosis   Neck Veins: normal JVP , neck veins are not distended   Thyroid: no nodules, masses, tenderness or enlargement   Chest Inspection: chest is normal in appearance   Respiratory Effort: breathing comfortably, no respiratory distress   Auscultation/Percussion: lungs clear to auscultation, no rales or rhonchi, no wheezing   PMI: PMI not enlarged or displaced   Cardiac Rhythm: regular rhythm and normal rate   Cardiac Auscultation: S1, S2 normal, no rub, no gallop   Murmurs: no murmur   Peripheral Circulation: normal peripheral circulation   Carotid Arteries: normal carotid upstroke bilaterally, no bruits   Radial Arteries: normal symmetric radial pulses   Abdominal Aorta: no abdominal aortic bruit   Pedal Pulses: normal symmetric pedal pulses   Lower Extremity Edema: no lower extremity edema   Abdominal Exam: soft, non-tender, no masses, bowel sounds normal   Liver & Spleen: no organomegaly   Gait & Station: walks without assistance   Muscle Strength: normal muscle tone   Orientation: oriented to time, place and person   Affect & Mood: appropriate and sustained affect   Language and Memory: patient responsive and seems to comprehend information   Neurologic Exam: neurological assessment grossly intact   Other: moves all extremities      Cardiovascular Health Factors  Vitals BP Readings from Last 3 Encounters:   12/16/23 139/88   11/19/22 122/62   04/09/22 (!) 142/82     Wt Readings from Last 3 Encounters:   12/16/23 56.1 kg (123 lb 9.6 oz)   11/19/22 59.4 kg (131 lb)   04/09/22 60.8 kg (134 lb)     BMI Readings from Last 3 Encounters:   12/16/23 22.61 kg/m?   11/19/22 23.96 kg/m?   04/09/22 24.51 kg/m?      Smoking Social History     Tobacco Use   Smoking Status Former    Current packs/day: 0.00    Average packs/day: 0.5 packs/day for 20.0 years (10.0 ttl pk-yrs)    Types: Cigarettes    Start date: 11/30/1964    Quit date: 11/30/1984    Years since quitting: 39.0   Smokeless Tobacco Never      Lipid Profile Cholesterol   Date Value Ref Range Status   12/10/2021 172  Final     HDL   Date Value Ref Range Status   12/10/2021 34 (L) >=40 Final     LDL   Date Value Ref Range Status   12/10/2021 89  Final     Triglycerides   Date Value Ref Range Status   12/10/2021 245 (H) <150 Final      Blood Sugar Hemoglobin A1C   Date Value Ref Range Status   12/25/2016 5.8  Final     Glucose   Date Value Ref Range Status   11/19/2022 108 (H) 70 - 105 Final   12/10/2021 111 (H) 70 - 105 Final   11/13/2020 108 (H) 70 - 105 Final          Problems Addressed Today  Encounter Diagnoses   Name Primary?    Coronary artery disease involving native coronary artery of native heart without angina pectoris Yes    Ischemic cardiomyopathy     Primary hypertension     Mixed hyperlipidemia  Automatic implantable cardioverter-defibrillator in situ        Assessment and Plan       Coronary artery disease  Anterior MI in 1986 was initial manifestation of CAD--POBA to LAD and RCA.  Cath in 2019 after presenting with nausea, diaphoresis (symptoms similar to MI) with LAD stent.    Last assessment was the arteriography in 2019.  I'm not planning surveillance stress tests unless she has worrisome symptoms.    Ischemic cardiomyopathy  Last EF was 44% with a radionuclide stress test in 2019.  She hasn't had overt heart failure symptoms; takes carvedilol and furosemide.    Hypertension  Carvedilol 12.5 BID, losartan 50, HCTZ 12.5/day    Hyperlipidemia  Lab Results   Component Value Date    CHOL 172 12/10/2021    TRIG 245 (H) 12/10/2021    HDL 34 (L) 12/10/2021    LDL 89 12/10/2021    VLDL 49 (H) 12/10/2021    NONHDLCHOL 103 01/08/2018    CHOLHDLC 5 12/10/2021      LDL goal < 70.  Atorva 40/day.    Automatic implantable cardioverter-defibrillator in situ  Last device check November, 2024, shows nearly 100% atrial pacing and < 1% RV pacing.  A little over 2 years expected battery longevity.  No anti-tachy therapies.    Current Medications (including today's revisions)   acetaminophen (TYLENOL EXTRA STRENGTH) 500 mg tablet Take one tablet by mouth every 6 hours as needed for Pain.    aspirin EC 81 mg tablet Take one tablet by mouth daily.    atorvastatin (LIPITOR) 40 mg tablet TAKE 1 TABLET BY MOUTH EVERY NIGHT AT BEDTIME    busPIRone (BUSPAR) 15 mg tablet Take one-half tablet by mouth twice daily.    carvediloL (COREG) 25 mg tablet Take one-half tablet by mouth twice daily with meals. (Patient taking differently: Take one tablet by mouth twice daily with meals.)    dorzolamide-timoloL (COSOPT) 2-0.5 % ophthalmic solution Apply one drop to both eyes twice daily.    furosemide (LASIX) 20 mg tablet Take one tablet by mouth every morning. (Patient not taking: Reported on 12/16/2023)    hydroCHLOROthiazide 12.5 mg tablet TAKE ONE TABLET BY MOUTH EVERY MORNING    latanoprost (XALATAN) 0.005 % ophthalmic solution Place one drop into or around eye(s) at bedtime daily. (Patient not taking: Reported on 12/16/2023)    losartan (COZAAR) 50 mg tablet Take one tablet by mouth daily.    pantoprazole DR (PROTONIX) 40 mg tablet Take one tablet by mouth twice daily.    potassium chloride SR (KLOR-CON M20) 20 mEq tablet Take one tablet by mouth daily. (Patient not taking: Reported on 12/16/2023)    traZODone (DESYREL) 50 mg tablet Take one tablet by mouth at bedtime daily.     Total time spent on today's office visit was 45 minutes.  This includes face-to-face in person visit with patient as well as nonface-to-face time including review of the EMR, outside records, labs, radiologic studies, echocardiogram & other cardiovascular studies, formation of treatment plan, after visit summary, future disposition, and lastly on documentation.

## 2023-12-16 NOTE — Assessment & Plan Note
Last device check November, 2024, shows nearly 100% atrial pacing and < 1% RV pacing.  A little over 2 years expected battery longevity.  No anti-tachy therapies.

## 2023-12-16 NOTE — Assessment & Plan Note
Carvedilol 12.5 BID, losartan 50, HCTZ 12.5/day

## 2024-01-03 ENCOUNTER — Encounter: Admit: 2024-01-03 | Discharge: 2024-01-03 | Payer: MEDICARE

## 2024-01-03 DIAGNOSIS — I2510 Atherosclerotic heart disease of native coronary artery without angina pectoris: Secondary | ICD-10-CM

## 2024-01-03 MED ORDER — ATORVASTATIN 40 MG PO TAB
ORAL_TABLET | 3 refills | Status: AC
Start: 2024-01-03 — End: ?

## 2024-01-15 ENCOUNTER — Ambulatory Visit: Admit: 2024-01-15 | Discharge: 2024-01-16 | Payer: MEDICARE

## 2024-04-03 ENCOUNTER — Encounter: Admit: 2024-04-03 | Discharge: 2024-04-03 | Payer: MEDICARE

## 2024-04-05 ENCOUNTER — Encounter: Admit: 2024-04-05 | Discharge: 2024-04-05 | Payer: MEDICARE

## 2024-04-05 ENCOUNTER — Ambulatory Visit: Admit: 2024-04-05 | Discharge: 2024-04-05 | Payer: MEDICARE

## 2024-04-05 DIAGNOSIS — Z95810 Presence of automatic (implantable) cardiac defibrillator: Secondary | ICD-10-CM

## 2024-04-05 NOTE — Telephone Encounter
-----   Message from [place] [facility] sent at 04/05/2024 10:55 AM CDT -----  Regarding: RE: sent carelink  See results below. Please place icd in office check order and have pt placed on device schedule at [facility]. [facility] so we can get this charted in Epic. Let me know if questions- thanks# Carelink express sent from [facility]. [facility] after OV with Dr. Dania Dupre. Inbasket sent to team with results.# Alerts or events: Multiple events labelled as NSVT/SVT/AT/AF. Most recent 04/05/24. EGMs are suggestive of brief AT and AF. <1% AT/AF burden. No true NSVT observed. Longest on 03/31/24 lasting a total of ~3 hours and 45 minutes with peak A/V rates 462/176 bpm. V rates in AF are > 100 bpm 70% of the time.# Battery: OK, 2.17 yrs # Sensing, impedance and thresholds reviewed: stable trends# Programmed parameters reviewed# Presenting rhythm reviewed: AP-VS 60 bpm# Heart Rate Histograms reviewed# HF diagnostics are stableSteph/Device  ----- Message -----  From: Garrel Kale  Sent: 04/05/2024   8:52 AM CDT  To: [facility] Ep Remote  Subject: sent carelink                                    Please advise of results

## 2024-04-06 ENCOUNTER — Encounter: Admit: 2024-04-06 | Discharge: 2024-04-06 | Payer: MEDICARE

## 2024-04-06 ENCOUNTER — Inpatient Hospital Stay: Admit: 2024-04-06 | Discharge: 2024-04-06 | Payer: MEDICARE

## 2024-04-06 DIAGNOSIS — Z136 Encounter for screening for cardiovascular disorders: Secondary | ICD-10-CM

## 2024-04-06 DIAGNOSIS — I10 Essential (primary) hypertension: Secondary | ICD-10-CM

## 2024-04-06 DIAGNOSIS — I255 Ischemic cardiomyopathy: Secondary | ICD-10-CM

## 2024-04-06 DIAGNOSIS — R079 Chest pain, unspecified: Secondary | ICD-10-CM

## 2024-04-06 DIAGNOSIS — I2510 Atherosclerotic heart disease of native coronary artery without angina pectoris: Secondary | ICD-10-CM

## 2024-04-06 DIAGNOSIS — I071 Rheumatic tricuspid insufficiency: Secondary | ICD-10-CM

## 2024-04-06 DIAGNOSIS — I5189 Other ill-defined heart diseases: Secondary | ICD-10-CM

## 2024-04-06 DIAGNOSIS — I259 Chronic ischemic heart disease, unspecified: Secondary | ICD-10-CM

## 2024-04-06 LAB — BASIC METABOLIC PANEL
GLUCOSE,PANEL: 116 — ABNORMAL HIGH (ref 70–105)
SODIUM: 139

## 2024-04-06 LAB — CBC
HEMATOCRIT: 38 — ABNORMAL HIGH (ref 9.8–20.1)
HEMOGLOBIN: 12 — ABNORMAL HIGH (ref 0.000–0.013)
MCHC: 32
MCV: 98 — ABNORMAL HIGH (ref 79.4–94.8)
MPV: 9.5
PLATELET COUNT: 247
RBC COUNT: 3.9 — ABNORMAL HIGH (ref 0–100)
WBC COUNT: 8.5

## 2024-04-06 LAB — MAGNESIUM: ~~LOC~~ BKR MAGNESIUM: 1.8 mg/dL (ref 1.6–2.6)

## 2024-04-06 LAB — ECG 12-LEAD: VENTRICULAR RATE: 62 {beats}/min — ABNORMAL HIGH (ref 0.40–1.00)

## 2024-04-06 LAB — PHOSPHORUS: ~~LOC~~ BKR PHOSPHORUS: 3.9 mg/dL (ref 2.0–4.5)

## 2024-04-06 LAB — CBC AND DIFF
~~LOC~~ BKR MCV: 94 fL (ref 80.0–100.0)
~~LOC~~ BKR RBC COUNT: 3.9 10*6/uL — ABNORMAL LOW (ref 4.00–5.00)
~~LOC~~ BKR WBC COUNT: 8.2 10*3/uL (ref 4.50–11.00)

## 2024-04-06 MED ORDER — SENNOSIDES-DOCUSATE SODIUM 8.6-50 MG PO TAB
1 | Freq: Every day | ORAL | 0 refills | Status: DC | PRN
Start: 2024-04-06 — End: 2024-04-11

## 2024-04-06 MED ORDER — ASPIRIN 81 MG PO CHEW
81 mg | Freq: Every day | ORAL | 0 refills | Status: DC
Start: 2024-04-06 — End: 2024-04-11
  Administered 2024-04-07 – 2024-04-11 (×5): 81 mg via ORAL

## 2024-04-06 MED ORDER — CARVEDILOL 6.25 MG PO TAB
6.25 mg | Freq: Two times a day (BID) | ORAL | 0 refills | Status: DC
Start: 2024-04-06 — End: 2024-04-11
  Administered 2024-04-07 – 2024-04-11 (×9): 6.25 mg via ORAL

## 2024-04-06 MED ORDER — ENOXAPARIN 40 MG/0.4 ML SC SYRG
40 mg | Freq: Every day | SUBCUTANEOUS | 0 refills | Status: DC
Start: 2024-04-06 — End: 2024-04-11
  Administered 2024-04-07 – 2024-04-11 (×5): 40 mg via SUBCUTANEOUS

## 2024-04-06 MED ORDER — DORZOLAMIDE 2 % OP DROP
1 [drp] | Freq: Two times a day (BID) | OPHTHALMIC | 0 refills | Status: DC
Start: 2024-04-06 — End: 2024-04-11
  Administered 2024-04-07: 02:00:00 1 [drp] via OPHTHALMIC

## 2024-04-06 MED ORDER — ATORVASTATIN 40 MG PO TAB
40 mg | Freq: Every day | ORAL | 0 refills | Status: DC
Start: 2024-04-06 — End: 2024-04-11
  Administered 2024-04-07 – 2024-04-11 (×5): 40 mg via ORAL

## 2024-04-06 MED ORDER — ONDANSETRON 4 MG PO TBDI
4 mg | ORAL | 0 refills | Status: DC | PRN
Start: 2024-04-06 — End: 2024-04-11

## 2024-04-06 MED ORDER — PANTOPRAZOLE 40 MG PO TBEC
40 mg | Freq: Every day | ORAL | 0 refills | Status: DC
Start: 2024-04-06 — End: 2024-04-11
  Administered 2024-04-07 – 2024-04-11 (×5): 40 mg via ORAL

## 2024-04-06 MED ORDER — POLYETHYLENE GLYCOL 3350 17 GRAM PO PWPK
1 | Freq: Every day | ORAL | 0 refills | Status: DC | PRN
Start: 2024-04-06 — End: 2024-04-11

## 2024-04-06 MED ORDER — TIMOLOL MALEATE 0.5 % OP DROP
1 [drp] | Freq: Two times a day (BID) | OPHTHALMIC | 0 refills | Status: DC
Start: 2024-04-06 — End: 2024-04-11
  Administered 2024-04-07: 02:00:00 1 [drp] via OPHTHALMIC

## 2024-04-06 MED ORDER — ONDANSETRON HCL (PF) 4 MG/2 ML IJ SOLN
4 mg | INTRAVENOUS | 0 refills | Status: DC | PRN
Start: 2024-04-06 — End: 2024-04-11

## 2024-04-06 MED ORDER — TRAZODONE 50 MG PO TAB
50 mg | Freq: Every evening | ORAL | 0 refills | Status: DC
Start: 2024-04-06 — End: 2024-04-11
  Administered 2024-04-07 – 2024-04-11 (×5): 50 mg via ORAL

## 2024-04-06 MED ORDER — LOSARTAN 25 MG PO TAB
50 mg | Freq: Every day | ORAL | 0 refills | Status: DC
Start: 2024-04-06 — End: 2024-04-07

## 2024-04-06 MED ORDER — FLUTICASONE PROPIONATE 50 MCG/ACTUATION NA SPSN
2 | Freq: Every day | NASAL | 0 refills | Status: DC
Start: 2024-04-06 — End: 2024-04-11
  Administered 2024-04-07: 02:00:00 2 via NASAL

## 2024-04-06 MED ORDER — MELATONIN 5 MG PO TAB
5 mg | Freq: Every evening | ORAL | 0 refills | Status: DC | PRN
Start: 2024-04-06 — End: 2024-04-11
  Administered 2024-04-09 – 2024-04-10 (×2): 5 mg via ORAL

## 2024-04-06 MED ORDER — BUSPIRONE 5 MG PO TAB
7.5 mg | Freq: Two times a day (BID) | ORAL | 0 refills | Status: DC
Start: 2024-04-06 — End: 2024-04-11
  Administered 2024-04-07 – 2024-04-11 (×11): 7.5 mg via ORAL

## 2024-04-06 NOTE — H&P (View-Only)
 Internal Medicine History and Physical      Patient's Name:  Grace Wright MRN: [id number]   Today's Date:  04/06/2024  Admission Date: 04/06/2024  LOS: 0 days    Problem list  Active Problems:    * No active hospital problems. *        Assessment and Plan      Mrs. Grace Wright is a pleasant 88 y/o female with a history of HTN, dyslipidemia, CAD, s/p [place] angioplasty to RCA and LAD (1987), and HFrEF (EF 25%) s/p ICD who is a direct admit for evaluation of chest pain and 3 weeks of general malaise.     # Chest Pain   # CAD s/p PCI (8119)  # CTO of RCA  - Follow Dr. Dania Dupre in cardiology clinic. Last seen 04/05/24.   - OSH BNP 233, Troponin 0.026   - Stress test (2018) : markedly abnormal but with no evidence of significant myocardial ischemia. There is an extensive area of infarction anteriorly in the LAD distribution. This is non viable. Interestingly there are no high risk prognostic indicators present. The ECG portion of the study is negative for ischemia.   - Cardiac cath (2019) with LAD stent placement and Chronically thrombosed RCA.   - PTA medications: ASA 81 mg Daily,  Atorvastatin 40 mg Daily,   Plan  > ECG  > HS Troponin Panel, CBC, CMP.   > NPO at [facility]   > will plan for MPI stress test in the AM   > Continue PTA ASA and Atorvastatin        # HFrEF (25%)   # NICM   - 04/05/24 Echo: LVEF 23 % with global hypokeneisis and akinesis of the inferior wall. PASP 30,   - Not on any diuretics at this time. (Furosemide d/c's 12/2023; Hctz d/c'd 04/05/24 )  - PTA meds: Losartan 50 mg Daily, Carvedilol 6.25 mg BID,  Plan:  > NT-Pro BNP ordered  > Continue PTA medications      # Anxiety   # Insomnia   Plan  > Continue PTA Trazodone 50 qH, buspirone 7.5 mg Daily      #Weight loss   - gradual weight loss over the last year  - Poor appetite   Plan:  > Nutrition consult     Allergies   > Continue Flonase       FEN:  > Diet: DIET NPO AT MIDNIGHT Sips With Medications  > IVF: None  > Monitor and replace electrolytes as needed    LDA:  Lines: PIV  Urinary Catheter: None    Prophylaxis Review:  VTE ppx: SCDs, Lovenox   GI:  PPI  Bowel regimen: Pt having regular BMs    Code Status:  Prior    DISPOSITION: Admit to Medicine      Discussed with Dr. Suzanna Erp Ivyrose Hashman, DO  Internal Medicine, PGY-2  Available on Voalte  Pager [phone]      Subjective:     Chief Complaint:  General Malaise, weight loss, Intermittent chest pain    Mrs. Grace Wright is a pleasant 88 y/o who is directly admitted for evaluation of generalized weakness and intermittent chest pain.  She presented to [facility] ED on 4/22 for her generalized weakness and was treated with a medrol dose pack and Augmentin. She continued to have symptoms after completing this and was provided 5 days of prednisone. Despite this, she continues to report feeling unwell. On 5/5, she developed a sudden  chest tightness, like someone was grabbing my chest. And returned to the ER. She did have an elevated troponin and BNP, but no follow-up labs were drawn. She was seen by Dr. Dania Dupre in the cardiology clinic on 5/7. At that time she reported feeling unwell.    On my evaluation, she is normotensive with a paced rhythm. She does not appear distressed. She reports weakness, but denies any fevers or chills or recent illness. She states she has started taking Claratin and flonase for allergies. Her hydrochlorothiazide was also discontinued in cardiology clinic yesterday. She has not been eating well and admits to lowing weight over the last 6 months. She denies any shortness of breath, nausea, emesis,. Abdominal pain, peripheral edema, or urinary symptoms.     Review of Systems   Constitutional:  Positive for malaise/fatigue and weight loss. Negative for chills and fever.   Respiratory:  Negative for cough, sputum production, shortness of breath and wheezing.    Cardiovascular:  Positive for chest pain. Negative for palpitations, orthopnea and leg swelling.   Gastrointestinal:  Negative for abdominal pain, constipation, diarrhea, nausea and vomiting.   Genitourinary:  Negative for dysuria and urgency.   Skin:  Negative for itching and rash.   Neurological:  Negative for focal weakness, loss of consciousness and headaches.       Past medical history:  The patient  has a past medical history of Acute MI anterior wall first episode care (CMS-HCC) (09/04/1986), Arrhythmia, Coronary artery disease (09/03/2009), Diastolic dysfunction (09/03/2009), Hyperlipidemia (09/03/2009), Hypertension (09/03/2009), Ischemic cardiomyopathy (09/03/2009), Mitral regurgitation (09/03/2009), and Tricuspid regurgitation (09/03/2009).    Social history:  - Tobacco: Denies  - EtOH: Denies  - Substances: Denies  - Living: lives in [place] with husband  Social History     Tobacco Use    Smoking status: Former     Current packs/day: 0.00     Average packs/day: 0.5 packs/day for 20.0 years (10.0 ttl pk-yrs)     Types: Cigarettes     Start date: 11/30/1964     Quit date: 11/30/1984     Years since quitting: 39.3    Smokeless tobacco: Never   Substance Use Topics    Alcohol use: No    Drug use: No       Past surgical history:  The patient  has a past surgical history that includes coronary stent placement; pacemaker placement; Cardiac defibrillator placement; heart catheterization; and hysterectomy.    Family History:  family history includes Cancer in her mother; Heart Attack in her father; Sudden Cardiac Death in her father.    Objective:     Vital Signs:  BP 136/64 (BP Source: Arm, Left Upper)  - Pulse 61  - Temp 36.6 ?C (97.9 ?F)  - SpO2 96%     Physical Exam  Vitals and nursing note reviewed.   Constitutional:       General: She is not in acute distress.     Appearance: Normal appearance.   HENT:      Head: Normocephalic and atraumatic.      Mouth/Throat:      Mouth: Mucous membranes are moist.      Pharynx: Oropharynx is clear.   Cardiovascular:      Rate and Rhythm: Normal rate and regular rhythm.      Pulses: Normal pulses.      Heart sounds: Normal heart sounds. No murmur heard.  Pulmonary:      Effort: Pulmonary effort is normal. No respiratory distress.  Breath sounds: Normal breath sounds. No wheezing.   Chest:      Comments: ICD chamber in place to left anterior chest  Abdominal:      General: Abdomen is flat. There is no distension.      Palpations: Abdomen is soft.   Musculoskeletal:         General: No swelling.   Skin:     General: Skin is warm and dry.   Neurological:      Mental Status: She is alert.         Labs:  Recent Labs     04/06/24  0000   HGB 12.6   HCT 38.9   WBC 8.59   PLTCT 247   NA 139 - 139   K 3.6 - 3.6   CL 109* - 109*   CO2 23 - 23.0   BUN 39.7* - 39.7*   CR 1.16* - 1.16*   GLU 116* - 116*   CA 9.1 - 9.1        BNP: ordered  CARDIAC ENZYMES: ordered    No results found.    Meds:  Scheduled Meds:aspirin chewable tablet 81 mg, 81 mg, Oral, QDAY  atorvastatin (LIPITOR) tablet 40 mg, 40 mg, Oral, QDAY  busPIRone (BUSPAR) tablet 7.5 mg, 7.5 mg, Oral, BID  carvediloL (COREG) tablet 6.25 mg, 6.25 mg, Oral, BID  dorzolamide (TRUSOPT) 2 % ophthalmic solution 1 drop, 1 drop, Both Eyes, BID   And  timoloL maleate (TIMOPTIC) 0.5 % ophthalmic drops 1 drop, 1 drop, Both Eyes, BID  enoxaparin (LOVENOX) syringe 40 mg, 40 mg, Subcutaneous, QDAY(21)  fluticasone propionate (FLONASE) nasal spray 2 spray, 2 spray, Each Nostril, QDAY  losartan (COZAAR) tablet 50 mg, 50 mg, Oral, QDAY  pantoprazole DR (PROTONIX) tablet 40 mg, 40 mg, Oral, QDAY(21)  traZODone (DESYREL) tablet 50 mg, 50 mg, Oral, QHS    Continuous Infusions:  PRN and Respiratory Meds:melatonin QHS PRN, ondansetron Q6H PRN **OR** ondansetron (ZOFRAN) IV Q6H PRN, polyethylene glycol 3350 QDAY PRN, sennosides-docusate sodium QDAY PRN

## 2024-04-06 NOTE — Telephone Encounter
 04/06/2024 12:43 PM   RN asked nurse in clinic with Dr. Dania Dupre to discuss patient's elevated HS troponin and BNP with Dr. Dania Dupre. Per nurse in clinic, Dr. Dania Dupre recommends patient be directly admitted to [place] so she can get worked up for cath/CTA abdomen in house at [facility] etc to f/u on cough, fatigue, wt loss, CP symptoms.     04/06/2024 1:00 PM   RN paged CVM triage pager and spoke with Triage RN Sam. Informed triage RN of Dr. Faythe Hopes recommendation for direct admit and provided brief Triage RN brief report on patient's symptoms and history. Triage RN indicates they will call patient when a bed is available. RN informed triage RN that per Dr. Dania Dupre, because pt is having weight loss, nausea, and fatigue, he would like pt admitted to medicine with cardiology consult for ongoing chest pain and elevated troponin and BNP.     04/06/2024 1:12 PM   RN spoke with patient over the phone and informed pt that Dr. Dania Dupre would like pt to be directly admitted to the hospital due to symptoms and elevated troponin. Pt agreeable to admission. RN instructed pt to wait at her home and that she will receive a call when a bed becomes available, and she should head to the main hospital at that time. Pt verbalized understanding and reports she will work on finding a ride to the hospital.

## 2024-04-06 NOTE — Progress Notes
 Medicare is listed as patient's primary insurance coverage.  Pre-certification is not required for hospitalizations.

## 2024-04-07 ENCOUNTER — Inpatient Hospital Stay: Admit: 2024-04-07 | Discharge: 2024-04-07 | Payer: MEDICARE

## 2024-04-07 LAB — REGADENOSON MPI STRESS TEST
BASELINE BP DIASTOLIC: 67 mmHg (ref 0.2–1.3)
BASELINE BP: 92 mmHg (ref 6.0–8.0)
BASELINE HR: 62 {beats}/min (ref 8.5–10.6)
CV NUCLEAR BMI: 21 kg/m2
EXERCISE DURATION MIN: 4 mL/min — ABNORMAL LOW (ref >60–4.80)
LV END DIASTOLIC VOLUME: 73 mL
MPI EF: 47 %
PEAK BP DIASTOLIC: 60 mmHg (ref 7–56)
PEAK BP: 130 mmHg (ref 4.0–12.0)
PEAK HR: 72 {beats}/min — ABNORMAL LOW (ref 25–110)
PERCENT OF PREDICTED MAX HR: 53 % (ref 21–30)
POST ESTIMATED WORKLOAD: 1 ms (ref 3.5–5.0)
REST DOSE: 26 mCi
STRESS DOSE: 8.8 mCi
STRESS PEAK DOUBLE PRODUCT: 843 10*3/uL (ref 3–12)
SUMMED REST SCORE: 42
SUMMED STRESS SCORE: 48
TID RATIO: 1

## 2024-04-07 LAB — RVP VIRAL PANEL PCR: ~~LOC~~ BKR HUMAN RHINOVIRUS/ENTEROVIRUS: DETECTED — AB

## 2024-04-07 LAB — NT-PRO-BNP: ~~LOC~~ BKR NT-PRO-BNP: 181 pg/mL — ABNORMAL HIGH (ref <450)

## 2024-04-07 LAB — HIGH SENSITIVITY TROPONIN I 2 HOUR
~~LOC~~ BKR HIGH SENSITIVITY TROPONIN I 2 HOUR: 10 ng/L (ref <15)
~~LOC~~ BKR HIGH SENSITIVITY TROPONIN I DELTA VALUE: -2

## 2024-04-07 LAB — TSH WITH FREE T4 REFLEX: ~~LOC~~ BKR TSH: 3.1 [IU]/mL — ABNORMAL HIGH (ref 0.35–5.00)

## 2024-04-07 MED ORDER — ACETAMINOPHEN 325 MG PO TAB
650 mg | ORAL | 0 refills | Status: DC | PRN
Start: 2024-04-07 — End: 2024-04-11
  Administered 2024-04-07 – 2024-04-10 (×6): 650 mg via ORAL

## 2024-04-07 MED ORDER — ASPIRIN 325 MG PO TAB
325 mg | Freq: Once | ORAL | 0 refills | Status: CP
Start: 2024-04-07 — End: ?
  Administered 2024-04-10: 13:00:00 325 mg via ORAL

## 2024-04-07 MED ADMIN — AMINOPHYLLINE 25 MG/ML IV SOLN [88540]: 50 mg | INTRAVENOUS | @ 16:00:00 | Stop: 2024-04-08 | NDC 00409592116

## 2024-04-07 MED ADMIN — REGADENOSON 0.4 MG/5 ML IV SYRG [168865]: 0.4 mg | INTRAVENOUS | @ 16:00:00 | Stop: 2024-04-07 | NDC 55150044301

## 2024-04-07 MED ADMIN — RP DX TC-99M TETROFOSMIN MCI [210516]: 8 | INTRAVENOUS | @ 18:00:00 | Stop: 2024-04-07 | NDC 54029389609

## 2024-04-07 MED ADMIN — RP DX TC-99M TETROFOSMIN MCI [210516]: 24 | INTRAVENOUS | @ 18:00:00 | Stop: 2024-04-07 | NDC 54029389609

## 2024-04-07 NOTE — Case Management (ED)
 Case Management Admission Assessment    NAME:Grace Wright                          MRN: [id number]             DOB:[birth year]          AGE: 88 y.o.  ADMISSION DATE: 04/06/2024             DAYS ADMITTED: LOS: 1 day      Today?s Date: 04/07/2024    Source of Information: Patient and EMR    Per H&P:   88 y/o female with a history of HTN, dyslipidemia, CAD, s/p Baloon angioplasty to RCA and LAD (1987), and HFrEF (EF 25%) s/p ICD who is a direct admit for evaluation of chest pain and 3 weeks of general malaise      Plan  Plan: Case Management Assessment, Assist PRN with SW/NCM Services    NCM met with patient and family at bedside.  NCM introduced self and explained role in DC process.  NCM allowed time throughout the assessment for patient and family members to ask questions.      Anticipate DC home when medically stable. Family to provide transportation home when medically stable.     Patient confirms listed demographics, stating that she resides alone in a first floor single level apartment.     Patient confirms PCP as Dr. Tyler Darland and reports having recently seen him.     Patient confirms that she has Medicare A&B with a Part D plan through [employer]. Patient denies issues with affordability.     Patient with hx of OP PT in 1986 following massive heart attack.     No DME  No Hx of HH   No Hx of placement     NCM/SW team will continue to follow and assist with DC needs      *upon entering and leaving the patient's room it was noticed that the patients room door does not clasp shut.  NCM discussed this with unit secretary - she will notify maintenance to fix door.        Patient Address/Phone  [address]  [place] [address]  [phone] (home)     Emergency Contact  Extended Emergency Contact Information  Primary Emergency Contact: Kanaan,James  Address: [address]           [place], [place] [address] [place]   Home Phone: [phone]  Work Phone: [phone]  Mobile Phone: [phone]  Relation: Son    Transport planner  Does the Patient Need Case Management to Arrange Discharge Transport? (ex: facility, ambulance, wheelchair/stretcher, Medicaid, cab, other): No  Will the Patient Use Family Transport?: Yes  Transportation Name, Phone and Availability #1: Anyiah Coverdale, son. [phone]    Expected Discharge Date   04/08/2024    Living Situation Prior to Admission  Living Arrangements  Type of Residence: Home, independent  Living Arrangements: Alone  Financial risk analyst / Tub: Tub/Shower Unit  How many levels in the residence?: 1  Can patient live on one level if needed?: Yes  Does residence have entry and/or inside stairs?: No  Assistance needed prior to admit or anticipated on discharge: No  Who provides assistance or could if needed?: Family  Are they in good health?: Unknown  Can support system provide 24/7 care if needed?: Maybe  Level of Function   Prior level of function:  Independent  Cognitive Abilities   Cognitive Abilities: Insurance account manager Resources  Coverage  Primary Insurance: Medicare  Secondary Insurance: Commercial insurance  Medication Coverage    Medication Coverage: Medicare Part D  Have you experienced a noticeable increase in your copay costs recently?: No  Are current medications affordable?: Yes  Do You Use a Co-Pay Card or a Medication Assistance Program to Help Manage Medication Costs?: No  Do You Manage Your Own Medications?: Yes  Source of Income   Source Of Income: Other retirement income  Financial Assistance Needed?  No    Psychosocial Needs  Mental Health  Mental Health History: No  Substance Use History  Substance Use History Screen: No  Other  No    Current/Previous Services  PCP  Esequiel Hector, [phone], [phone]  Pharmacy    [facility] - [place], [place] - [address]  [address]  [place] [address]  Phone: [phone] Fax: [phone]    Durable Medical Equipment   Durable Medical Equipment at home: None  Home Health  Receiving home health: No  Hemodialysis or Peritoneal Dialysis  Undergoing hemodialysis or peritoneal dialysis: No  Tube/Enteral Feeds  Receive tube/enteral feeds: No  Infusion  Receive infusions: No  Private Duty  Private duty help used: No  Home and Community Based Services  Home and community based services: No  Ryan White  Ryan White: No  Hospice  Hospice: No  Outpatient Therapy  PT: In the past  When did patient receive care?: 1986 - following a massive heart attack  OT: No  SLP: No  Skilled Nursing Facility/Nursing Home  SNF: No  NH: No  Inpatient Rehab  IPR: No  Long-Term Acute Care Hospital  LTACH: No  Acute Hospital Stay  Acute Hospital Stay: No      Mariella Shore BSN, RN Case Sports administrator of [facility]  Voalte: Mariella Shore

## 2024-04-07 NOTE — Progress Notes
 END OF SHIFT/PLAN OF CARE NURSING NOTE    Admission Date: 04/06/2024  Length of Stay: LOS: 1 day    Acute events, pain management, and nursing interventions: no pain reported overnight. Pt was educated about her diet order changing from regular to NPO at midnight.     Communication with providers: none     Intake and Output:        Intake/Output Summary (Last 24 hours) at 04/07/2024 1610  Last data filed at 04/07/2024 0020  Gross per 24 hour   Intake 60 ml   Output --   Net 60 ml        Last Bowel Movement Date:  (PTA)    Fall Risk/JHFRAT Interventions and Education:   Elimination: toilet  Medications: 2 or more  Bed/chair alarm: on  Patient Care Equipment: tele  Mobility: SBA  Cognition: AOx4    Patient Education  Patient-specific education provided related to quality/safety:  Fall risk and Isolation precaution  Patient-specific education provided (other): medications, NPO diet order   Learners: Patient  Method/materials used: Therapist, art  Response to learning: [employer] Understanding  Needs reinforcement on: n/a    Patient Goal(s):  Patient will Verbalize readiness for discharge by discharge date         Patient will  Demonstrate improved self care by discharge date   Other:    Restraints:  No  Restraint Goal: Patient will be free from injury while physically restrained.  See Docflowsheet for restraint documentation, interventions, education, etc.

## 2024-04-07 NOTE — Progress Notes
 Internal Medicine Daily Progress Note      Patient's Name:  Grace Wright MRN: [id number]   Today's Date:  04/07/2024  Admission Date: 04/06/2024  LOS: 1 day    Problem list  Principal Problem:    Chest pain      Assessment and Plan:     Grace Wright is a 88 y.o. woman with a PMH of HTN, dyslipidemia, CAD, s/p Baloon angioplasty to RCA and LAD (1987), and HFrEF (EF 25%) s/p ICD placement who was a direct admit on 04/06/2024 for evaluation of chest pains and general malaise for 3 week and found to be rhinovirus positive.    04/07/2024 Updates:  - Rhinovirus positive on RVP   - MPI stress test today,  will consult cardiology if results show noew or reversible ischemia   - If stress test negative, consider CT chest/abd/pelvis to rule out other source of infection     # Chest Pain   # CAD s/p PCI (9147)  # CTO of RCA  - Follow Dr. Dania Dupre in cardiology clinic. Last seen 04/05/24.   - OSH BNP 233, Troponin 0.026   - HS troponin 12 -> 10  - NT Pro BNP 1818  - On exam, Patient appears euvolemic  - Stress test (2018) : markedly abnormal but with no evidence of significant myocardial ischemia. There is an extensive area of infarction anteriorly in the LAD distribution. This is non viable. Interestingly there are no high risk prognostic indicators present. The ECG portion of the study is negative for ischemia.   - Cardiac cath (2019) with LAD stent placement and Chronically thrombosed RCA.   - PTA medications: ASA 81 mg Daily,  Atorvastatin 40 mg Daily,   Plan  > Awaiting MPI stress test results  > Continue PTA ASA and Atorvastatin    > Monitor on telemetry   > K+ >4.0, MG > 2.0        # HFrEF (25%)   # NICM   - 04/05/24 Echo: LVEF 23 % with global hypokeneisis and akinesis of the inferior wall. PASP 30,   - Not on any diuretics at this time. (Furosemide d/c's 12/2023; Hctz d/c'd 04/05/24 )  - PTA meds: Losartan 50 mg Daily, Carvedilol 6.25 mg BID,  Plan:  > NT-Pro BNP ordered  > Continue PTA medications       #Rhinovirus  - 04/07/24 RVP w/ positive rhinovius   Plan:  > Continue conservative management     # Elevated Creatinine - improving   - Cr 1.27 on admission (baseline ~0.9)   - Improved to 1.05 without intervention   Plan:  >Continue to monitor daily Bmp         # Anxiety   # Insomnia   Plan  > Continue PTA Trazodone 50 qH, buspirone 7.5 mg Daily        #Weight loss   - gradual weight loss over the last year  - Poor appetite   Plan:  > Nutrition consult      Allergies   > Continue Flonase          Diet: DIET NPO AT MIDNIGHT Sips With Medications  VTE ppx: Enoxaparin  CODE: Full Code    DISPO: Continue admission to Med 3     Seen and discussed with Dr. Suzanna Erp Altin Sease, DO  Internal Medicine, PGY-2  Available on Voalte  Pager [phone]      Subjective:     No acute  overnight events. Grace Wright reports feeling well this morning and has no acute complaints. She denies any more episodes of chest pain since admission. Discussed plans for a stress test today. She is looking forward to the results.       Objective:   Vital Signs:  BP 125/72 (BP Source: Arm, Left Upper)  - Pulse 71  - Temp 37 ?C (98.6 ?F)  - SpO2 97%     Intake/Output Summary:  (Last 24 hours)    Intake/Output Summary (Last 24 hours) at 04/07/2024 0739  Last data filed at 04/07/2024 0020  Gross per 24 hour   Intake 60 ml   Output --   Net 60 ml           Physical Exam  Vitals and nursing note reviewed.   Constitutional:       Appearance: Normal appearance. She is normal weight.   HENT:      Head: Normocephalic and atraumatic.   Cardiovascular:      Rate and Rhythm: Normal rate and regular rhythm.      Pulses: Normal pulses.      Heart sounds: Normal heart sounds. No murmur heard.  Pulmonary:      Effort: Pulmonary effort is normal. No respiratory distress.      Breath sounds: Normal breath sounds.   Abdominal:      General: Abdomen is flat. Bowel sounds are normal. There is no distension.      Palpations: Abdomen is soft.      Tenderness: There is no abdominal tenderness.   Musculoskeletal:         General: No swelling.   Skin:     General: Skin is warm and dry.      Findings: No rash.   Neurological:      General: No focal deficit present.      Mental Status: She is alert and oriented to person, place, and time.          Labs:  Recent Labs     04/06/24  0000 04/06/24  1808 04/07/24  0656   HGB 12.6 12.2 11.9*   WBC 8.59 8.20 8.60   PLTCT 247 245 210   NA 139 - 139 142 141   K 3.6 - 3.6 4.0 4.1   CL 109* - 109* 107 109   CO2 23 - 23.0 24 24   BUN 39.7* - 39.7* 45* 39*   CR 1.16* - 1.16* 1.28* 1.05*   GLU 116* - 116* 100 87   CA 9.1 - 9.1 9.1 9.0   MG  --  1.8  --    PO4  --  3.9  --    ALBUMIN  --  3.8 3.5   AST  --  14 13   ALT  --  11 11   ALKPHOS  --  63 59   TOTBILI  --  0.6 0.6       Meds:  Scheduled Meds:aspirin chewable tablet 81 mg, 81 mg, Oral, QDAY  atorvastatin (LIPITOR) tablet 40 mg, 40 mg, Oral, QDAY  busPIRone (BUSPAR) tablet 7.5 mg, 7.5 mg, Oral, BID  carvediloL (COREG) tablet 6.25 mg, 6.25 mg, Oral, BID  dorzolamide (TRUSOPT) 2 % ophthalmic solution 1 drop, 1 drop, Both Eyes, BID   And  timoloL maleate (TIMOPTIC) 0.5 % ophthalmic drops 1 drop, 1 drop, Both Eyes, BID  enoxaparin (LOVENOX) syringe 40 mg, 40 mg, Subcutaneous, QDAY(21)  fluticasone propionate (FLONASE) nasal spray 2 spray, 2 spray,  Each Nostril, QDAY  pantoprazole DR (PROTONIX) tablet 40 mg, 40 mg, Oral, QDAY(21)  traZODone (DESYREL) tablet 50 mg, 50 mg, Oral, QHS    Continuous Infusions:  PRN and Respiratory Meds:melatonin QHS PRN, ondansetron Q6H PRN **OR** ondansetron (ZOFRAN) IV Q6H PRN, polyethylene glycol 3350 QDAY PRN, sennosides-docusate sodium QDAY PRN      Wounds:  Active Wounds                               Nutrition:  Malnutrition Details:

## 2024-04-07 NOTE — Consults
 Cardiovascular Medicine Initial Consult Note   Admission Date: 04/06/2024  Date of Consultation:  04/07/2024  LOS: 1 day  Requesting Physician: Tyna Gallery, MD  Code Status: Full Code    Reason for Consultation  Opinion and recommendations    Assessment & Plan   This is a 88 y.o. female admitted for angina. We are consulted for abnormal stress test.     Principal Problem:    Chest pain          Impression and Recommendations:    Angina  H/o MI s/p POBA to LAD and RCA in 1987 and LAD PCI in 2019  HFrEF s/p ICD for ischemic cardiomyopathy  HTN  HLD  AT/AF episodes on device check    Grace Wright is an 88 y/o woman with h/o HTN, HLD, CAD s/p balloon angioplasty to RCA and LAD in 1987 in the setting of anterior MI and subsequent PCI to LAD in 2019, who was admitted for chest pain with mildly elevated regular trop but normal HS trop to the hospital. She is also RSV positive. Stress test shows extensive infarct but also small area of reversible ischemia.  Patient reports chest pain at rest lasting for 15-20 minutes since Easter holidays, not related to exertion but has felt fatigued overall. She doesn't have NTG at home. Last time she had chest pain in 2019 when she was found to have LAD disease which was stented.  Echo shows EF of 25%. Trop has been negative. Renal function is stable. She has had weight loss due to lack of appetite.  Risk factors: HTN, HLD, former smoker, quit after heart attack.    Due to angina symptoms and known CAD, with possible reversible lesion on stress in addition to prior infarct, plan on LHC/coronary angiography with possible PCI on Monday.  NPO after midnight on Sunday night.  Continue aspirin, continue GDMT with Coreg 6.25 mg BID. This could be further optimized if blood pressure allows.  Device check shows AT-AF episodes, will see if she has any while here on tele. Longest episode 1 hr 26 min on 5/2.      Cardiology team will will follow remotely.      Thank you for allowing us  to participate in the care of this patient. Discussed with Dr. Kovelamudi who agrees with plan above, please review their separate attestation for any changes to the plan. After 5PM please call Cardiology fellow on call.  Monday - Friday 8AM-5PM please call Cardiology consult pager    Jackqulyn Masse, MBBS  Cardiovascular Diseases Fellow  Pager: [phone]  04/07/2024  4:24 PM    EKG: 04/06/2024  Atrial-paced rhythm with occasional premature ventricular complexes  Left axis deviation  LVH w/ repol   IVCD    ECHO: 04/05/2024    The left ventricular size is normal. The left ventricular wall thickness is normal. The left ventricular systolic function is severely reduced. The ejection fraction by Simpson's biplane method is 23%. There are segmental wall motion abnormalities, as described below.    The right ventricular size is normal. The right ventricular systolic function is mildly reduced.    Normal biatrial size.    No significant valvular disease.    Estimated Peak Systolic PA Pressure 30 mmHg    No pericardial effusion.    No prior for comparison.       Stress MPI: 04/07/2024  SUMMARY/OPINION:  This study is markedly abnormal.  There is a large size, severe intensity, predominantly fixed perfusion defect involving  the mid to apical anterior, anteroseptal, inferoseptal, and inferior wall segments as well as the true left ventricular apex.  Findings are consistent with prior myocardial injury.  There is also a small sized, severe intensity, partially fixed/partially reversible perfusion defect in all segments. Left ventricular systolic function is normal. There are no high risk prognostic indicators present.  The pharmacologic ECG portion of the study is negative for ischemia.     Comparison is made with report from prior D SPECT study completed 02/14/2017 (images not available for comparison).  Ejection fraction was calculated at 44%.  By description, the perfusion pattern is relatively similar between the two studies with evidence of extensive myocardial injury in the anterior, septal, and apical segments.     In aggregate the current study is high risk in regards to predicted annual cardiovascular mortality rate.    Coronary Angiogram: 01/07/2018  IMPRESSION:    A 60% to 70% proximal to mid left anterior descending eccentric, tortuous, calcified stenosis which was hemodynamically significant with FFR of 0.80, which was ultimately treated with 3 x 38 mm Xience drug-eluting stent.  CT of right coronary artery which was previously reported.  Normal left ventricular end diagnostic pressure.    No gradient across the aortic valve on pullback.      Cardiac Medications: aspirin    Subjective   History of Present Illness:  Grace Wright is a 88 y.o. female patient with h/o HTN, HLD, CAD s/p balloon angioplasty to RCA and LAD in 1987 in the setting of anterior MI and subsequent PCI to LAD in 2019, who was admitted for chest pain with mildly elevated regular trop but normal HS trop to the hospital. She is also RSV positive. Stress test shows extensive infarct but also small area of reversible ischemia.  Patient reports chest pain at rest lasting for 15-20 minutes since Easter holidays, not related to exertion but has felt fatigued overall. She doesn't have NTG at home so has not been using it.  She had chest pain with exertion in 2019 and cath showed significant LAD disease which was stented. She is otherwise active at baseline, lives independently and takes care of her own household chores without symptoms. She has also experienced fatigue and weight loss due to loss of appetite.  Denies dyspnea currently, or leg swellings, orthopnea and PND. She hasn't had any episode of chest pain since she has been here. Recommended she tell the nurse if she has an episode while in the hospital.    Telemetry Review: A paced V sensed rhythm    Past Medical History:  Past Medical History:    Acute MI anterior wall first episode care (CMS-HCC)    Arrhythmia Coronary artery disease    Diastolic dysfunction    Hyperlipidemia    Hypertension    Ischemic cardiomyopathy    Mitral regurgitation    Tricuspid regurgitation       Social History:  Social History     Socioeconomic History    Marital status: Widowed    Number of children: 2   Occupational History     Employer: RETIRED   Tobacco Use    Smoking status: Former     Current packs/day: 0.00     Average packs/day: 0.5 packs/day for 20.0 years (10.0 ttl pk-yrs)     Types: Cigarettes     Start date: 11/30/1964     Quit date: 11/30/1984     Years since quitting: 39.3    Smokeless tobacco: Never  Substance and Sexual Activity    Alcohol use: No    Drug use: No       Surgical History:  Surgical History:   Procedure Laterality Date    ANGIOGRAPHY CORONARY ARTERY WITH LEFT HEART CATHETERIZATION N/A 01/07/2018    Performed by Drucella Georgi, MD at [facility] CATH LAB    PERCUTANEOUS CORONARY STENT PLACEMENT WITH ANGIOPLASTY N/A 01/07/2018    Performed by Drucella Georgi, MD at [facility] CATH LAB    REMOVAL AND REPLACEMENT IMPLANTABLE DEFIBRILLATOR GENERATOR - DUAL LEAD SYSTEM Left 03/31/2018    Performed by Lynnetta Sauce, MD at [facility] CATH LAB    CARDIAC DEFIBRILLATOR PLACEMENT      HX CORONARY STENT PLACEMENT      HX HEART CATHETERIZATION      HX HYSTERECTOMY      partial    HX PACEMAKER PLACEMENT         Family History:  Family History   Problem Relation Name Age of Onset    Cancer Mother      Sudden Cardiac Death Father      Heart Attack Father         Review of Systems:  A 14 point ROS was obtained and was negative other than what is mentioned above     Physical Exam                          Vital Signs: Most Recent                 Vital Signs: 24 Hour Range   BP: 118/57 (05/09 1600)  Temp: 36.7 ?C (98.1 ?F) (05/09 1600)  Pulse: 61 (05/09 1600)  Respirations: 18 PER MINUTE (05/09 1600)  SpO2: 93 % (05/09 1600)  O2 Device: None (Room air) (05/09 1600) BP: (118-144)/(54-72)   Temp:  [36.6 ?C (97.9 ?F)-37 ?C (98.6 ?F)]   Pulse:  [60-77] Respirations:  [18 PER MINUTE]   SpO2:  [93 %-99 %]   O2 Device: None (Room air)     There were no vitals filed for this visit.    Intake/Output Summary (Last 24 hours) at 04/07/2024 1624  Last data filed at 04/07/2024 1603  Gross per 24 hour   Intake 450 ml   Output 0 ml   Net 450 ml        GEN: well appearing 88 y.o. female who appears stated age and is no acute distress  HEAD: normocephalic  EYES: sclera non icteric  MOUTH: PMMM  LUNGS: CTA bil.  HEART: RRR without murmur or gallop appreciated  EXTR: no C/C/E   SKIN: warm and dry  NEUR: A&Ox3  PSYCH: calm and cooperative    Objective   Medications:  aspirin chewable tablet 81 mg, 81 mg, Oral, QDAY  atorvastatin (LIPITOR) tablet 40 mg, 40 mg, Oral, QDAY  busPIRone (BUSPAR) tablet 7.5 mg, 7.5 mg, Oral, BID  carvediloL (COREG) tablet 6.25 mg, 6.25 mg, Oral, BID  dorzolamide (TRUSOPT) 2 % ophthalmic solution 1 drop, 1 drop, Both Eyes, BID   And  timoloL maleate (TIMOPTIC) 0.5 % ophthalmic drops 1 drop, 1 drop, Both Eyes, BID  enoxaparin (LOVENOX) syringe 40 mg, 40 mg, Subcutaneous, QDAY(21)  fluticasone propionate (FLONASE) nasal spray 2 spray, 2 spray, Each Nostril, QDAY  pantoprazole DR (PROTONIX) tablet 40 mg, 40 mg, Oral, QDAY(21)  traZODone (DESYREL) tablet 50 mg, 50 mg, Oral, QHS      acetaminophen Q4H PRN, melatonin QHS  PRN, ondansetron Q6H PRN **OR** ondansetron (ZOFRAN) IV Q6H PRN, polyethylene glycol 3350 QDAY PRN, sennosides-docusate sodium QDAY PRN    Allergies:  Allergies   Allergen Reactions    Doxycycline BLISTERS    Zithromax [Azithromycin] DIARRHEA and NAUSEA AND VOMITING       Labs:  24-hour labs:    Results for orders placed or performed during the hospital encounter of 04/06/24 (from the past 24 hours)   HIGH SENSITIVITY TROPONIN I 2 HOUR    Collection Time: 04/06/24  8:37 PM   Result Value Ref Range    hs Troponin I 2 Hour 10 <15 ng/L    hs Troponin I 2-0hr Delta Value -2    RVP VIRAL PANEL PCR    Collection Time: 04/06/24  8:39 PM    Specimen: Nasal; Swab   Result Value Ref Range    Adenovirus Not Detected Not Detected, Test Invalid    Coronavirus 229E Not Detected Not Detected, Test Invalid    Coronavirus HKU1 Not Detected Not Detected, Test Invalid    Coronavirus NL63 Not Detected Not Detected, Test Invalid    Coronavirus OC43 Not Detected Not Detected, Test Invalid    Human Metapneumovirus Not Detected Not Detected, Test Invalid    Human Rhinovirus/Enterovirus Detected (A) Not Detected, Test Invalid    Influenza A Not Detected Not Detected, Test Invalid    Influenza B Virus Not Detected Not Detected, Test Invalid    Parainfluenza virus 1 Not Detected Not Detected, Test Invalid    Parainfluenza virus 2 Not Detected Not Detected, Test Invalid    Parainfluenza virus 3 Not Detected Not Detected, Test Invalid    Parainfluenza virus 4 Not Detected Not Detected, Test Invalid    RESP SYNCTIAL VIRUS RVP Not Detected Not Detected, Test Invalid    Bordetella parapertussis Not Detected Not Detected, Test Invalid    Bordetella pertussis PCR Not Detected Not Detected, Test Invalid    Chlamydia pneumoniae PCR Not Detected Not Detected, Test Invalid    Mycoplasma pneumoniae Not Detected Not Detected, Test Invalid    SARS-COV 2 Not Detected Not Detected   CBC AND DIFF    Collection Time: 04/07/24  6:56 AM   Result Value Ref Range    White Blood Cells 8.60 4.50 - 11.00 10*3/uL    Red Blood Cells 3.71 (L) 4.00 - 5.00 10*6/uL    Hemoglobin 11.9 (L) 12.0 - 15.0 g/dL    Hematocrit 16.1 (L) 36.0 - 45.0 %    MCV 94.5 80.0 - 100.0 fL    MCH 32.1 26.0 - 34.0 pg    MCHC 33.9 32.0 - 36.0 g/dL    RDW 09.6 04.5 - 40.9 %    Platelet Count 210 150 - 400 10*3/uL    MPV 7.6 7.0 - 11.0 fL    Neutrophils 67.7 41.0 - 77.0 %    Lymphocytes 17.1 (L) 24.0 - 44.0 %    Monocytes 11.0 4.0 - 12.0 %    Eosinophils 3.8 0.0 - 5.0 %    Basophils 0.4 0.0 - 2.0 %    Absolute Neutrophil Count 5.80 1.80 - 7.00 10*3/uL    Absolute Lymph Count 1.50 1.00 - 4.80 10*3/uL    Absolute Monocyte Count 0.90 (H) 0.00 - 0.80 10*3/uL    Absolute Eosinophil Count 0.30 0.00 - 0.45 10*3/uL    Absolute Basophil Count 0.00 0.00 - 0.20 10*3/uL   COMPREHENSIVE METABOLIC PANEL    Collection Time: 04/07/24  6:56 AM   Result Value Ref  Range    Sodium 141 137 - 147 mmol/L    Potassium 4.1 3.5 - 5.1 mmol/L    Chloride 109 98 - 110 mmol/L    Glucose 87 70 - 100 mg/dL    Blood Urea Nitrogen 39 (H) 7 - 25 mg/dL    Creatinine 1.61 (H) 0.40 - 1.00 mg/dL    Calcium 9.0 8.5 - 09.6 mg/dL    Total Protein 5.9 (L) 6.0 - 8.0 g/dL    Total Bilirubin 0.6 0.2 - 1.3 mg/dL    Albumin 3.5 3.5 - 5.0 g/dL    Alk Phosphatase 59 25 - 110 U/L    AST 13 7 - 40 U/L    ALT 11 7 - 56 U/L    CO2 24 21 - 30 mmol/L    Anion Gap 8 3 - 12    Glomerular Filtration Rate (GFR) 52 (L) >60 mL/min       Jackqulyn Masse, MBBS

## 2024-04-07 NOTE — Consults
 CLINICAL NUTRITION                                                        Clinical Nutrition Initial Assessment    Name: Grace Wright   MRN: [id number]     DOB: 28-Jul-1936      Age: 88 y.o.  Admission Date: 04/06/2024     LOS: 1 day     Date of Service: 04/07/2024    Recommendation:  ADAT to goal of Regular menu   Provide encouragement and assistance with ordering TID meals + snacks including 1-2 protein sources/meal   When PO intake resumes, encourage Ensure Plus High Protein supplement TID with meals; ordered same   Ordered standing weight ; no wt source on current wt     Comments:  Grace Wright is a pleasant 88 y/o female with a history of HTN, dyslipidemia, CAD, s/p Baloon angioplasty to RCA and LAD (1987), and HFrEF (EF 25%) s/p ICD who is a direct admit for evaluation of chest pain and 3 weeks of general malaise.     Clinical nutrition consulted for Weight loss over last 6 month, requesting supplementation recs. Attempted 3x to meet with pt at bedside. Pt off unit for testing during attempts therefore unable to complete subjective nutrition assessment. Per Chart review: pt with potential severe weight loss, 10.6% wt loss in unknown timeframe. Po intake since admit: 0% of 1 meal. Reporting to primary team poor appetite and gradual wt loss over 1 year. Malnutrition eval pending NFPE and subjective hx. Pt is at acute nutrition risk, ordered Oral Nutrition Supplements to start when diet resumes. Will attempt to meet with pt and complete consult at next available date. Ongoing monitoring and evaluation of Po intake, GI symptoms, nutr related labs and meds, clinical course, GOC, and discharge planning.    Nutrition Assessment of Patient:  Wt Readings from Last 5 Encounters:   04/05/24 53.1 kg (117 lb)   04/05/24 53.4 kg (117 lb 12.8 oz)   12/16/23 56.1 kg (123 lb 9.6 oz)   11/19/22 59.4 kg (131 lb)   04/09/22 60.8 kg (134 lb)   Pertinent Allergies/Intolerances: NKFA  Nutrition related labs reviewed, of note: Na, Cl, Mg, Phos WNL; eGFR 52L   Nutrition related meds reviewed, of note: zofran, protonix        Oral Diet Order: NPO at midnight;       Current Energy Intake: Inadequate           Weight Used for Calculation: 53.1 kg  Estimated Calorie Needs: 1330-1593 (25-30kcal/kg ABW)  Estimated Protein Needs: 65-70 (1.2-1.3g/kg ABW)    Malnutrition Assessment:   Evaluation pending   Nutrition Focused Physical Exam:  Unable to assess   Physical Assessment:  RLE Edema: Non-pitting  LLE Edema: Non-pitting  Pedal Edema: Non-pitting     Pressure Injury: none noted     Comment: last bm PTA    Nutrition Diagnosis:  Inadequate oral intake  Etiology: lack of appetite  Signs & Symptoms: pt report of poor intakes and weight loss unknonw amount                      Intervention / Plan:  Optimize po intake with high pro high cal foods, education and counseling, oral nutrition supplements, and micronutrient supplementation  ADAT to goal of regular  Monitor/evaluation of: PO intake, weight trends, labs, GI function, clinical course, and GOC.      Devonna Foley, RDN, LD  Available on Voalte   [phone]

## 2024-04-07 NOTE — Progress Notes
 I have reviewed the notes, assessment, and/or procedures performed by [name]  and concur with her/his documentation unless otherwise noted.

## 2024-04-08 NOTE — Progress Notes
 PHYSICAL THERAPY  NOTE      Name: Grace Wright   MRN: [id number]     DOB: 04-07-1936      Age: 88 y.o.  Admission Date: 04/06/2024     LOS: 2 days     Date of Service: 04/08/2024      RN confirms patient is ambulating in the room without difficulty. Patient's current status and level of safety suggests progression of activity can be achieved with nursing and/or family, and does not require physical therapist intervention. Physical therapy and occupational therapy will be discontinued at this time.     Therapist: Aloysius Janus, PT  Date: 04/08/2024

## 2024-04-08 NOTE — Progress Notes
 Internal Medicine Daily Progress Note      Patient's Name:  Grace Wright MRN: [id number]   Today's Date:  04/08/2024  Admission Date: 04/06/2024  LOS: 2 days    Problem list  Principal Problem:    Chest pain      Assessment and Plan:     Grace Wright is a 88 y.o. woman with a PMH of HTN, dyslipidemia, CAD, s/p Baloon angioplasty to RCA and LAD (1987), and HFrEF (EF 25%) s/p ICD placement who was a direct admit on 04/06/2024 for evaluation of chest pains and general malaise for 3 week and found to be rhinovirus positive.    04/08/2024 Updates:  - CARDS  planning for LHC with possible PCI on Monday 04/10/24  - Rhinovirus positive on RVP     # Chest Pain   # CAD s/p PCI (4010)  # CTO of RCA  - Follow Dr. Dania Dupre in cardiology clinic. Last seen 04/05/24.   - OSH BNP 233, Troponin 0.026   - HS troponin 12 -> 10  - NT Pro BNP 1818  - On exam, Patient appears euvolemic  - Stress test (2018) : markedly abnormal but with no evidence of significant myocardial ischemia. There is an extensive area of infarction anteriorly in the LAD distribution. This is non viable. Interestingly there are no high risk prognostic indicators present. The ECG portion of the study is negative for ischemia.   - Cardiac cath (2019) with LAD stent placement and Chronically thrombosed RCA.   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  There is a large size, severe intensity, predominantly fixed perfusion defect involving the mid to apical anterior, anteroseptal, inferoseptal, and inferior wall segments as well as the true left ventricular apex.  Findings are consistent with prior myocardial injury.  There is also a small sized, severe intensity, partially fixed/partially reversible perfusion defect in all segments. Left ventricular systolic function is normal. There are no high risk prognostic indicators present.  The pharmacologic ECG portion of the study is negative for ischemia.  Plan  > CARDS  planning for LHC with possible PCI on Monday 04/10/24  > Continue PTA ASA and Atorvastatin    > Monitor on telemetry   > K+ >4.0, MG > 2.0     # HFrEF (25%)   # NICM   - 04/05/24 Echo: LVEF 23 % with global hypokeneisis and akinesis of the inferior wall. PASP 30,   - Not on any diuretics at this time. (Furosemide d/c's 12/2023; Hctz d/c'd 04/05/24 )  - 04/06/24 NT-pro-BNP: 1,818  - PTA meds: Losartan 50 mg Daily, Carvedilol 6.25 mg BID  Plan:  > Continue PTA carvedilol  > Holding PTA losartan    #Rhinovirus  - 04/07/24 RVP w/ positive rhinovius   Plan:  > Continue conservative management     # Elevated Creatinine - improving   - Cr 1.27 on admission (baseline ~0.9)   - Improved to 1.05 without intervention   - 04/08/24 Cr 0.83  Plan:  >Continue to monitor daily Bmp      # Anxiety   # Insomnia   Plan  > Continue PTA Trazodone 50 qH, buspirone 7.5 mg Daily     #Weight loss   - gradual weight loss over the last year  - Poor appetite  Plan:  > Nutrition consult      #Seasonal allergies   > Continue Flonase       Diet: DIET REGULAR  DIET NPO AT MIDNIGHT Sips With Medications  VTE ppx: Enoxaparin  CODE: Full Code  DISPO: Continue inpatient Med-3     Seen and discussed with Dr. Claretha Crocker, MD  Anesthesiology PGY-1    Subjective:     -No acute overnight events    Grace Wright reports to be feeling well this morning with no complaints. Has had no recurrence of chest pain. Is ready to undergo planned LHC on Monday 5/12. Ate breakfast this morning, but does not like the supplemental protein shakes. All questions and concerns answered      Objective:   Vital Signs:  BP 114/52 (BP Source: Arm, Right Upper)  - Pulse 62  - Temp 36.6 ?C (97.8 ?F)  - Ht 152.4 cm (5')  - Wt 54.8 kg (120 lb 12.8 oz)  - SpO2 96%  - BMI 23.59 kg/m?     Intake/Output Summary:  (Last 24 hours)    Intake/Output Summary (Last 24 hours) at 04/08/2024 1452  Last data filed at 04/08/2024 1141  Gross per 24 hour   Intake 440 ml   Output 100 ml   Net 340 ml           Physical Exam  Vitals and nursing note reviewed.   Constitutional:       Appearance: Normal appearance. She is normal weight.   HENT:      Head: Normocephalic and atraumatic.   Cardiovascular:      Rate and Rhythm: Normal rate and regular rhythm.      Pulses: Normal pulses.      Heart sounds: Normal heart sounds. No murmur heard.  Pulmonary:      Effort: Pulmonary effort is normal. No respiratory distress.      Breath sounds: Normal breath sounds.   Abdominal:      General: Abdomen is flat. Bowel sounds are normal. There is no distension.      Palpations: Abdomen is soft.      Tenderness: There is no abdominal tenderness.   Musculoskeletal:         General: No swelling.   Skin:     General: Skin is warm and dry.      Findings: No rash.   Neurological:      General: No focal deficit present.      Mental Status: She is alert and oriented to person, place, and time.          Labs:  Recent Labs     04/06/24  1808 04/07/24  0656 04/08/24  0731   HGB 12.2 11.9* 11.4*   WBC 8.20 8.60 6.90   PLTCT 245 210 186   NA 142 141 142   K 4.0 4.1 3.7   CL 107 109 110   CO2 24 24 22    BUN 45* 39* 31*   CR 1.28* 1.05* 0.83   GLU 100 87 104*   CA 9.1 9.0 8.4*   MG 1.8  --   --    PO4 3.9  --   --    ALBUMIN 3.8 3.5 3.1*   AST 14 13 13    ALT 11 11 9    ALKPHOS 63 59 62   TOTBILI 0.6 0.6 0.5       Meds:  Scheduled Meds:aspirin chewable tablet 81 mg, 81 mg, Oral, QDAY  [  START ON 04/10/2024] aspirin tablet 325 mg, 325 mg, Oral, ONCE  atorvastatin (LIPITOR) tablet 40 mg, 40 mg, Oral, QDAY  busPIRone (BUSPAR) tablet 7.5 mg, 7.5 mg, Oral, BID  carvediloL (COREG) tablet 6.25 mg, 6.25 mg, Oral, BID  dorzolamide (TRUSOPT) 2 % ophthalmic solution 1 drop, 1 drop, Both Eyes, BID   And  timoloL maleate (TIMOPTIC) 0.5 % ophthalmic drops 1 drop, 1 drop, Both Eyes, BID  enoxaparin (LOVENOX) syringe 40 mg, 40 mg, Subcutaneous, QDAY(21)  fluticasone propionate (FLONASE) nasal spray 2 spray, 2 spray, Each Nostril, QDAY  pantoprazole DR (PROTONIX) tablet 40 mg, 40 mg, Oral, QDAY(21)  traZODone (DESYREL) tablet 50 mg, 50 mg, Oral, QHS    Continuous Infusions:  PRN and Respiratory Meds:acetaminophen Q4H PRN, melatonin QHS PRN, ondansetron Q6H PRN **OR** ondansetron (ZOFRAN) IV Q6H PRN, polyethylene glycol 3350 QDAY PRN, sennosides-docusate sodium QDAY PRN      Wounds:  Active Wounds                               Nutrition:  Malnutrition Details:

## 2024-04-08 NOTE — Progress Notes
 Chaplain Note:    Patient is Catholic.  Chaplain visited to offer prayer, support and sacramental care.   Patient appeared calm and hopeful but concerned about his health and asked for prayer and sacramental care for healing.  Patient shared about his illness and concerns.  Chaplain offered active listening, support, prayer and provided the Sacrament of the Sick / Last Rites for peace, hope and healing.  Patient appreciated for visit.  Continue available for support and prayer.     The spiritual care team is available as needed, 24/7, through the campus switchboard [phone]). For a response within 24 hours, please submit an order in O2 for a chaplain consult.

## 2024-04-08 NOTE — Progress Notes
 OCCUPATIONAL THERAPY  NOTE   Name: Grace Wright   MRN: [id number]     DOB: 04-Aug-1936      Age: 88 y.o.  Admission Date: 04/06/2024     LOS: 2 days     Date of Service: 04/08/2024    Per discussion with PT, patient reports no concerns with completing activities of daily living with no OT goals identified. OT will discontinue service, please re-consult if the patient has a decline in functional status.     Therapist: Johnn Najjar, OT  Date: 04/08/2024

## 2024-04-08 NOTE — Progress Notes
 FALL REFUSAL EDUCATION     This RN educated patient on the importance of Fall bundle compliance in the hospital setting. Reviewed potential risks/outcomes with patient if a fall were to happen.     This RN gave education to patient as to why they are considered a fall risk. Patient continuing to refuse fall bundle elements despite multiple attempts of re-education.     Fall Bundle refusal escalated to appropriate team members (NM, UE, MD). Will continue to educate patient during hospital stay on fall bundle compliance.

## 2024-04-08 NOTE — Unmapped
 Patient/family declined:  Bed/chair alarm and W/in arms reach during toileting/showering.    Patient/family educated on importance of intervention to their safety/quality of care. Patient/family continues to decline care.    Reason Why Patient/Family declined:  Pt comfortable self ambulating. Will call if assistance is needed..    Individualized safety and/or care plan implemented. If additional safety measures implemented, please list them.     Escalated to:  Unit coordinator/charge nurse.

## 2024-04-09 MED ORDER — LOSARTAN 25 MG PO TAB
50 mg | Freq: Every day | ORAL | 0 refills | Status: DC
Start: 2024-04-09 — End: 2024-04-11
  Administered 2024-04-09 – 2024-04-11 (×3): 50 mg via ORAL

## 2024-04-09 NOTE — Progress Notes
 Internal Medicine Daily Progress Note      Patient's Name:  Grace Wright MRN: [id number]   Today's Date:  04/09/2024  Admission Date: 04/06/2024  LOS: 3 days    Problem list  Principal Problem:    Chest pain      Assessment and Plan:     Grace Wright is a 88 y.o. woman with a PMH of HTN, dyslipidemia, CAD, s/p Baloon angioplasty to RCA and LAD (1987), and HFrEF (EF 25%) s/p ICD placement who was a direct admit on 04/06/2024 for evaluation of chest pains and general malaise for 3 week and found to be rhinovirus positive.    04/09/2024 Updates:  - CARDS  planning for LHC with possible PCI on Monday 04/10/24  - Rhinovirus positive on RVP     # Chest Pain   # CAD s/p PCI (9629)  # CTO of RCA  - Follow Dr. Dania Dupre in cardiology clinic. Last seen 04/05/24.   - OSH BNP 233, Troponin 0.026   - HS troponin 12 -> 10  - NT Pro BNP 1818  - On exam, Patient appears euvolemic  - Stress test (2018) : markedly abnormal but with no evidence of significant myocardial ischemia. There is an extensive area of infarction anteriorly in the LAD distribution. This is non viable. Interestingly there are no high risk prognostic indicators present. The ECG portion of the study is negative for ischemia.   - Cardiac cath (2019) with LAD stent placement and Chronically thrombosed RCA.   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  There is a large size, severe intensity, predominantly fixed perfusion defect involving the mid to apical anterior, anteroseptal, inferoseptal, and inferior wall segments as well as the true left ventricular apex.  Findings are consistent with prior myocardial injury.  There is also a small sized, severe intensity, partially fixed/partially reversible perfusion defect in all segments. Left ventricular systolic function is normal. There are no high risk prognostic indicators present.  The pharmacologic ECG portion of the study is negative for ischemia.  Plan  > CARDS  planning for LHC with possible PCI on Monday 04/10/24; NPO at midgnight  > Continue PTA ASA and Atorvastatin    > Monitor on telemetry   > K+ >4.0, MG > 2.0     # HFrEF (25%)   # NICM   - 04/05/24 Echo: LVEF 23 % with global hypokeneisis and akinesis of the inferior wall. PASP 30,   - Not on any diuretics at this time. (Furosemide d/c's 12/2023; Hctz d/c'd 04/05/24 )  - 04/06/24 NT-pro-BNP: 1,818  - PTA meds: Losartan 50 mg Daily, Carvedilol 6.25 mg BID  Plan:  > Continue PTA carvedilol  > Holding PTA losartan    #Rhinovirus  - 04/07/24 RVP w/ positive rhinovius   Plan:  > Continue conservative management     # Elevated Creatinine - improving   - Cr 1.27 on admission (baseline ~0.9)   - Improved to 1.05 without intervention   - 04/08/24 Cr 0.83  Plan:  >Continue to monitor daily Bmp      # Anxiety   # Insomnia   Plan  > Continue PTA Trazodone 50 qH, buspirone 7.5 mg Daily     #Weight loss   - gradual weight loss over the last year  - Poor  appetite   Plan:  > Nutrition consult      #Seasonal allergies   > Continue Flonase       Diet: DIET REGULAR  DIET NPO AT MIDNIGHT Sips With Medications  VTE ppx: Enoxaparin  CODE: Full Code  DISPO: Continue inpatient Med-3     Seen and discussed with Dr. Claretha Crocker, MD  Anesthesiology PGY-1    Subjective:     -No acute overnight events    Grace Wright reports to be feeling well this morning with no complaints. Denies any recurrence of chest pain at rest or while walking. Is ready to undergo planned LHC on Monday 5/12.       Objective:   Vital Signs:  BP (!) 168/68 (BP Source: Arm, Left Upper)  - Pulse 61  - Temp 36.2 ?C (97.2 ?F)  - Ht 152.4 cm (5')  - Wt 54.8 kg (120 lb 12.8 oz)  - SpO2 95%  - BMI 23.59 kg/m?     Intake/Output Summary:  (Last 24 hours)    Intake/Output Summary (Last 24 hours) at 04/09/2024 1138  Last data filed at 04/08/2024 2122  Gross per 24 hour Intake 80 ml   Output 0 ml   Net 80 ml           Physical Exam  Vitals and nursing note reviewed.   Constitutional:       Appearance: Normal appearance. She is normal weight.   HENT:      Head: Normocephalic and atraumatic.   Cardiovascular:      Rate and Rhythm: Normal rate and regular rhythm.      Pulses: Normal pulses.      Heart sounds: Normal heart sounds. No murmur heard.  Pulmonary:      Effort: Pulmonary effort is normal. No respiratory distress.      Breath sounds: Normal breath sounds.      Comments: Normal work of breathing on RA  Abdominal:      General: Abdomen is flat. Bowel sounds are normal. There is no distension.      Palpations: Abdomen is soft.      Tenderness: There is no abdominal tenderness.   Musculoskeletal:         General: No swelling.   Skin:     General: Skin is warm and dry.      Findings: No rash.   Neurological:      General: No focal deficit present.      Mental Status: She is alert and oriented to person, place, and time.          Labs:  Recent Labs     04/06/24  1808 04/07/24  0656 04/08/24  0731 04/09/24  0634   HGB 12.2 11.9* 11.4* 11.1*   WBC 8.20 8.60 6.90 7.40   PLTCT 245 210 186 173   NA 142 141 142 141   K 4.0 4.1 3.7 3.8   CL 107 109 110 110   CO2 24 24 22 22    BUN 45* 39* 31* 24   CR 1.28* 1.05* 0.83 0.71   GLU 100 87 104* 98   CA 9.1 9.0 8.4* 8.3*   MG 1.8  --   --   --    PO4 3.9  --   --   --    ALBUMIN 3.8 3.5 3.1* 3.0*   AST 14 13 13 14    ALT 11 11 9 10    ALKPHOS 63 59 62  54   TOTBILI 0.6 0.6 0.5 0.5       Meds:  Scheduled Meds:aspirin chewable tablet 81 mg, 81 mg, Oral, QDAY  [START ON 04/10/2024] aspirin tablet 325 mg, 325 mg, Oral, ONCE  atorvastatin (LIPITOR) tablet 40 mg, 40 mg, Oral, QDAY  busPIRone (BUSPAR) tablet 7.5 mg, 7.5 mg, Oral, BID  carvediloL (COREG) tablet 6.25 mg, 6.25 mg, Oral, BID  dorzolamide (TRUSOPT) 2 % ophthalmic solution 1 drop, 1 drop, Both Eyes, BID   And  timoloL maleate (TIMOPTIC) 0.5 % ophthalmic drops 1 drop, 1 drop, Both Eyes, BID  enoxaparin (LOVENOX) syringe 40 mg, 40 mg, Subcutaneous, QDAY(21)  fluticasone propionate (FLONASE) nasal spray 2 spray, 2 spray, Each Nostril, QDAY  pantoprazole DR (PROTONIX) tablet 40 mg, 40 mg, Oral, QDAY(21)  traZODone (DESYREL) tablet 50 mg, 50 mg, Oral, QHS    Continuous Infusions:  PRN and Respiratory Meds:acetaminophen Q4H PRN, melatonin QHS PRN, ondansetron Q6H PRN **OR** ondansetron (ZOFRAN) IV Q6H PRN, polyethylene glycol 3350 QDAY PRN, sennosides-docusate sodium QDAY PRN      Wounds:  Active Wounds                               Nutrition:  Malnutrition Details:

## 2024-04-09 NOTE — Progress Notes
 Patient/family declined:  Bed/chair alarm and W/in arms reach during toileting/showering.    Patient/family educated on importance of intervention to their safety/quality of care. Patient/family continues to decline care.    Reason Why Patient/Family declined:  Patient does not feel she is a fall risk.    Individualized safety and/or care plan implemented. If additional safety measures implemented, please list them.     Escalated to:  Unit coordinator/charge nurse.

## 2024-04-10 ENCOUNTER — Inpatient Hospital Stay: Admit: 2024-04-10 | Discharge: 2024-04-10 | Payer: MEDICARE

## 2024-04-10 ENCOUNTER — Encounter: Admit: 2024-04-10 | Discharge: 2024-04-10 | Payer: MEDICARE

## 2024-04-10 MED ORDER — DIPHENHYDRAMINE HCL 25 MG PO CAP
25 mg | ORAL | 0 refills | Status: DC | PRN
Start: 2024-04-10 — End: 2024-04-11

## 2024-04-10 MED ORDER — DIPHENHYDRAMINE HCL 50 MG/ML IJ SOLN
25 mg | INTRAVENOUS | 0 refills | Status: DC | PRN
Start: 2024-04-10 — End: 2024-04-11

## 2024-04-10 NOTE — Progress Notes
 Pre-Procedure Airway Assessment     Planned Procedure: LHC, Cor, Possible PCI     Time of last oral intake: >8hrs    Assessment: Airway assessment performed and No abnormalities    Note: If any factors present an anesthesia consult should be considered    [place]: II    ASA Class: ASA II (A normal patient with mild systemic disease)    Physician has discussed risks and alternatives of this type sedation and above planned procedure(s) with: Patient    Allergies:   Allergies   Allergen Reactions    Doxycycline BLISTERS    Zithromax [Azithromycin] DIARRHEA and NAUSEA AND VOMITING        Current Medications: Reviewed    Appropriate labs/diagnostic tests: Reviewed    Have the patient or anyone in the patient's family ever had a history of sedation/anesthesia complications? No

## 2024-04-10 NOTE — Progress Notes
 Internal Medicine Daily Progress Note      Patient's Name:  Grace Wright MRN: [id number]   Today's Date:  04/10/2024  Admission Date: 04/06/2024  LOS: 4 days    Problem list  Principal Problem:    Chest pain      Assessment and Plan:     Grace Wright is a 88 y.o. woman with a PMH of HTN, dyslipidemia, CAD, s/p Baloon angioplasty to RCA and LAD (1987), and HFrEF (EF 25%) s/p ICD placement who was a direct admit on 04/06/2024 for evaluation of chest pains and general malaise for 3 week and found to be rhinovirus positive.    04/10/2024 Updates:  - LHC with possible PCI on Monday 04/10/24; plan for discharge 04/11/24  - Rhinovirus positive on RVP     # Chest Pain   # CAD s/p PCI (6295)  # CTO of RCA  - Follow Dr. Dania Dupre in cardiology clinic. Last seen 04/05/24.   - OSH BNP 233, Troponin 0.026   - HS troponin 12 -> 10  - NT Pro BNP 1818  - On exam, Patient appears euvolemic  - Stress test (2018) : markedly abnormal but with no evidence of significant myocardial ischemia. There is an extensive area of infarction anteriorly in the LAD distribution. This is non viable. Interestingly there are no high risk prognostic indicators present. The ECG portion of the study is negative for ischemia.   - Cardiac cath (2019) with LAD stent placement and Chronically thrombosed RCA.   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  There is a large size, severe intensity, predominantly fixed perfusion defect involving the mid to apical anterior, anteroseptal, inferoseptal, and inferior wall segments as well as the true left ventricular apex.  Findings are consistent with prior myocardial injury.  There is also a small sized, severe intensity, partially fixed/partially reversible perfusion defect in all segments. Left ventricular systolic function is normal. There are no high risk prognostic indicators present.  The pharmacologic ECG portion of the study is negative for ischemia.  Plan  > LHC with possible PCI on Monday 04/10/24; plan for discharge 04/11/24  > Continue PTA ASA and Atorvastatin    > Monitor on telemetry   > K+ >4.0, MG > 2.0     # HFrEF (25%)   # NICM   - 04/05/24 Echo: LVEF 23 % with global hypokeneisis and akinesis of the inferior wall. PASP 30,   - Not on any diuretics at this time. (Furosemide d/c's 12/2023; Hctz d/c'd 04/05/24 )  - 04/06/24 NT-pro-BNP: 1,818  - PTA meds: Losartan 50 mg Daily, Carvedilol 6.25 mg BID  Plan:  > Continue PTA carvedilol  > Continue PTA losartan     #Rhinovirus  - 04/07/24 RVP w/ positive rhinovius   Plan:  > Continue conservative management     # Elevated Creatinine - improving   - Cr 1.27 on admission (baseline ~0.9)   - Improved to 1.05 without intervention   - 04/08/24 Cr 0.83  Plan:  >Continue to monitor daily bmp      # Anxiety   # Insomnia   Plan  > Continue PTA Trazodone 50 qH, buspirone 7.5 mg Daily     #Weight loss   - gradual weight loss over the last year  - Poor appetite  Plan:  > Nutrition consult      #Seasonal allergies   > Continue Flonase       Diet: DIET NPO AT MIDNIGHT Sips With Medications  VTE ppx: Enoxaparin  CODE: Full Code  DISPO: Continue inpatient Med-3     Seen and discussed with Dr. Agustin Host, MD  Anesthesiology PGY-1    Subjective:     -No acute overnight events    Grace Wright reports to be feeling well this morning. Family present in room during visit. Denies any recurrence of chest pain at rest or while walking. Is ready to undergo planned LHC today      Objective:   Vital Signs:  BP (!) 153/71 (BP Source: Arm, Left Upper)  - Pulse 59  - Temp 36.6 ?C (97.9 ?F)  - Ht 152.4 cm (5')  - Wt 54.8 kg (120 lb 12.8 oz)  - SpO2 96%  - BMI 23.59 kg/m?     Intake/Output Summary:  (Last 24 hours)    Intake/Output Summary (Last 24 hours) at 04/10/2024 1432  Last data filed at 04/09/2024 2049  Gross per 24 hour   Intake 210 ml   Output 900 ml   Net -690 ml           Physical Exam  Vitals and nursing note reviewed.   Constitutional:       Appearance: Normal appearance. She is normal weight.   HENT:      Head: Normocephalic and atraumatic.   Cardiovascular:      Rate and Rhythm: Normal rate and regular rhythm.      Pulses: Normal pulses.      Heart sounds: Normal heart sounds. No murmur heard.  Pulmonary:      Effort: Pulmonary effort is normal. No respiratory distress.      Breath sounds: Normal breath sounds.      Comments: Normal work of breathing on RA  Abdominal:      General: Abdomen is flat. Bowel sounds are normal. There is no distension.      Palpations: Abdomen is soft.      Tenderness: There is no abdominal tenderness.   Musculoskeletal:         General: No swelling.   Skin:     General: Skin is warm and dry.      Findings: No rash.   Neurological:      General: No focal deficit present.      Mental Status: She is alert and oriented to person, place, and time.          Labs:  Recent Labs     04/08/24  0731 04/09/24  0634 04/10/24  0604   HGB 11.4* 11.1* 11.2*   WBC 6.90 7.40 7.50   PLTCT 186 173 167   NA 142 141  --    K 3.7 3.8  --    CL 110 110  --    CO2 22 22  --    BUN 31* 24  --    CR 0.83 0.71  --    GLU 104* 98  --    CA 8.4* 8.3*  --    ALBUMIN 3.1* 3.0*  --    AST 13 14  --    ALT 9 10  --    ALKPHOS 62 54  --    TOTBILI 0.5 0.5  --        Meds:  Scheduled Meds:aspirin chewable tablet 81 mg, 81  mg, Oral, QDAY  atorvastatin (LIPITOR) tablet 40 mg, 40 mg, Oral, QDAY  busPIRone (BUSPAR) tablet 7.5 mg, 7.5 mg, Oral, BID  carvediloL (COREG) tablet 6.25 mg, 6.25 mg, Oral, BID  dorzolamide (TRUSOPT) 2 % ophthalmic solution 1 drop, 1 drop, Both Eyes, BID   And  timoloL maleate (TIMOPTIC) 0.5 % ophthalmic drops 1 drop, 1 drop, Both Eyes, BID  enoxaparin (LOVENOX) syringe 40 mg, 40 mg, Subcutaneous, QDAY(21)  fluticasone propionate (FLONASE) nasal spray 2 spray, 2 spray, Each Nostril, QDAY  losartan (COZAAR) tablet 50 mg, 50 mg, Oral, QDAY  pantoprazole DR (PROTONIX) tablet 40 mg, 40 mg, Oral, QDAY(21)  traZODone (DESYREL) tablet 50 mg, 50 mg, Oral, QHS    Continuous Infusions:  PRN and Respiratory Meds:acetaminophen Q4H PRN, melatonin QHS PRN, ondansetron Q6H PRN **OR** ondansetron (ZOFRAN) IV Q6H PRN, polyethylene glycol 3350 QDAY PRN, sennosides-docusate sodium QDAY PRN      Wounds:  Active Wounds                               Nutrition:  Malnutrition Details:

## 2024-04-11 ENCOUNTER — Encounter: Admit: 2024-04-11 | Discharge: 2024-04-11 | Payer: MEDICARE

## 2024-04-11 ENCOUNTER — Inpatient Hospital Stay: Admit: 2024-04-06 | Payer: MEDICARE

## 2024-04-11 DIAGNOSIS — Z809 Family history of malignant neoplasm, unspecified: Secondary | ICD-10-CM

## 2024-04-11 DIAGNOSIS — Z79899 Other long term (current) drug therapy: Secondary | ICD-10-CM

## 2024-04-11 DIAGNOSIS — R079 Chest pain, unspecified: Secondary | ICD-10-CM

## 2024-04-11 DIAGNOSIS — B974 Respiratory syncytial virus as the cause of diseases classified elsewhere: Secondary | ICD-10-CM

## 2024-04-11 DIAGNOSIS — F419 Anxiety disorder, unspecified: Secondary | ICD-10-CM

## 2024-04-11 DIAGNOSIS — G4700 Insomnia, unspecified: Secondary | ICD-10-CM

## 2024-04-11 DIAGNOSIS — I2582 Chronic total occlusion of coronary artery: Secondary | ICD-10-CM

## 2024-04-11 DIAGNOSIS — J302 Other seasonal allergic rhinitis: Secondary | ICD-10-CM

## 2024-04-11 DIAGNOSIS — I255 Ischemic cardiomyopathy: Secondary | ICD-10-CM

## 2024-04-11 DIAGNOSIS — Z8249 Family history of ischemic heart disease and other diseases of the circulatory system: Secondary | ICD-10-CM

## 2024-04-11 DIAGNOSIS — R5381 Other malaise: Secondary | ICD-10-CM

## 2024-04-11 DIAGNOSIS — E785 Hyperlipidemia, unspecified: Secondary | ICD-10-CM

## 2024-04-11 DIAGNOSIS — Z8241 Family history of sudden cardiac death: Secondary | ICD-10-CM

## 2024-04-11 DIAGNOSIS — Z95810 Presence of automatic (implantable) cardiac defibrillator: Secondary | ICD-10-CM

## 2024-04-11 DIAGNOSIS — I252 Old myocardial infarction: Secondary | ICD-10-CM

## 2024-04-11 DIAGNOSIS — Z90710 Acquired absence of both cervix and uterus: Secondary | ICD-10-CM

## 2024-04-11 DIAGNOSIS — I25118 Atherosclerotic heart disease of native coronary artery with other forms of angina pectoris: Secondary | ICD-10-CM

## 2024-04-11 DIAGNOSIS — Z87891 Personal history of nicotine dependence: Secondary | ICD-10-CM

## 2024-04-11 DIAGNOSIS — I5022 Chronic systolic (congestive) heart failure: Secondary | ICD-10-CM

## 2024-04-11 DIAGNOSIS — I428 Other cardiomyopathies: Secondary | ICD-10-CM

## 2024-04-11 DIAGNOSIS — I130 Hypertensive heart and chronic kidney disease with heart failure and stage 1 through stage 4 chronic kidney disease, or unspecified chronic kidney disease: Secondary | ICD-10-CM

## 2024-04-11 DIAGNOSIS — Z955 Presence of coronary angioplasty implant and graft: Secondary | ICD-10-CM

## 2024-04-11 LAB — BASIC METABOLIC PANEL
~~LOC~~ BKR ANION GAP: 10 (ref 3–12)
~~LOC~~ BKR BLD UREA NITROGEN: 14 mg/dL (ref 7–25)
~~LOC~~ BKR CALCIUM: 8.8 mg/dL (ref 8.5–10.6)
~~LOC~~ BKR CHLORIDE: 107 mmol/L (ref 98–110)
~~LOC~~ BKR CO2: 22 mmol/L (ref 21–30)
~~LOC~~ BKR CREATININE: 0.7 mg/dL (ref 0.40–1.00)
~~LOC~~ BKR GLOMERULAR FILTRATION RATE (GFR): >60 mL/min (ref >60)
~~LOC~~ BKR GLUCOSE, RANDOM: 113 mg/dL — ABNORMAL HIGH (ref 70–100)
~~LOC~~ BKR POTASSIUM: 3.9 mmol/L (ref 3.5–5.1)
~~LOC~~ BKR SODIUM, SERUM: 139 mmol/L (ref 137–147)

## 2024-04-11 MED ORDER — ENTRESTO 24-26 MG PO TAB
1 | ORAL_TABLET | Freq: Two times a day (BID) | ORAL | 0 refills | 30.00000 days | Status: DC
Start: 2024-04-11 — End: 2024-04-11

## 2024-04-11 MED ORDER — ENTRESTO 24-26 MG PO TAB
1 | ORAL_TABLET | Freq: Two times a day (BID) | ORAL | 0 refills | Status: CN
Start: 2024-04-11 — End: ?

## 2024-04-11 NOTE — Case Management (ED)
 Case Management Progress Note    NAME:Grace Wright                          MRN: [id number]              DOB:[birth year]          AGE: 88 y.o.  ADMISSION DATE: 04/06/2024             DAYS ADMITTED: LOS: 5 days      Today's Date: 04/11/2024    PLAN: pending d/c home today    Expected Discharge Date: 04/11/2024   Is Patient Medically Stable: Yes   Are there Barriers to Discharge? No    Pt is anticipated to be stable for d/c today.   Therapy has seen pt and no d/c needs indicated. Pt is up and ambulatory.   Pt has PCP in place.   Pt is currently on RA  Son [name] is anticipated to provide transportation at time of d/c.   No current case management needs identified for dc at this time.     INTERVENTION/DISPOSITION:  Discharge Planning              Discharge Planning: No Needs Identified  Transportation              Does the Patient Need Case Management to Arrange Discharge Transport? (ex: facility, ambulance, wheelchair/stretcher, Medicaid, cab, other): No  Will the Patient Use Family Transport?: Yes  Transportation Name, Phone and Availability #1: [name], son. [phone]  Support              Support: Huddle/team update  Info or Referral                 Positive SDOH Domains and Potential Barriers                   Medication Needs              Medication Needs: No Needs Identified                                 Financial              Financial: No Needs Identified  Legal              Legal: No Needs Identified  Other              Other/None: No needs identified  Discharge Disposition                                      [name] Integrated Nurse Case Manager [facility]  Ph [phone]  Available on Voalte

## 2024-04-11 NOTE — Discharge Instructions - Pharmacy
 Discharge Summary      Name: Grace Wright  Medical Record Number: [id number]        Account Number:  [id number]  Date Of Birth:  [birth year]                         Age:  88 y.o.  Admit date:  04/06/2024                     Discharge date: 04/11/2024      Discharge Attending:  Dr. Valma Gazella, MD  Discharge Summary Completed By: Vermell Goes, MD    Service: Med 3- 2071    Reason for hospitalization:  Chest pain [R07.9]    Primary Discharge Diagnosis:   Chest pain    Hospital Diagnoses:  Hospital Problems        Resolved Problems    * (Principal) RESOLVED: Chest pain     Present on Admission:   (Resolved) Chest pain        Significant Past Medical History        Acute MI anterior wall first episode care (CMS-HCC)  Arrhythmia  Coronary artery disease  Diastolic dysfunction  Hyperlipidemia  Hypertension  Ischemic cardiomyopathy  Mitral regurgitation  Tricuspid regurgitation    Allergies   Doxycycline and Zithromax [azithromycin]    Brief Hospital Course   The patient was admitted and the following issues were addressed during this hospitalization: (with pertinent details including admission exam/imaging/labs).      Grace Wright is a 88 y.o. woman with a PMH of HTN, dyslipidemia, CAD, s/p Baloon angioplasty to RCA and LAD (1987), and HFrEF (EF 25%) s/p ICD placement who was a direct admit on 04/06/2024 for evaluation of chest pain and general malaise for 3 weeks and found to be rhinovirus positive.     On 04/06/24 admission ecg with no signs of ischemia. HS troponin wnls 12 -> 10. NT Pro BNP 1818. CXR normal. Stress MPI completed on 04/07/2024 that showed a large size, predominantly fixed perfusion defect consistent with prior myocardial injury, along with small sized, severe intensity, partially fixed/partially reversible perfusion defect in all segments. Cardiology was consulted and performed left heart catheterization via access of R radial artery on 04/10/24. Procedure demonstrated 99% subtotal occlusion of proximal right coronary artery with mild nonobstructive coronary artery disease of the LAD and Circumflex artery. There was no intervention or stenting.    On 5/13 patient was feeling well after the procedure with no recurrence of chest pain. Cardiology recommended optimization of medical therapy with plan to start Entresto, however after price check with insurance this medication was determined to be cost prohibitive and patient remained on her PTA medication regimen. Patient discharged to home in stable condition with scheduled outpatient cardiology follow-up.    Day of discharge exam notable for:   General: alert, cooperative, NAD  HEENT: normocephalic/atraumatic, non-icteric  Cardio: regular rate, regular rhythm   Pulm: non-labored respirations on RA, no stridor  Abd: soft, non-distended, no erythema  Ext: warm, dry, no edema/cyanosis, radial access site clean and dry without hematoma  Vascular: palpable radial, DP pulses bilaterally   Neuro: grossly intact, answers questions appropriately  Psych: behavior and mood appropriate        Items Needing Follow Up   Pending items or areas that need to be addressed at follow up:   -Follow-up with outpatient Cardiology (scheduled apt. for 05/16/24)    Pending Labs and Follow Up  Radiology    Pending labs and/or radiology review at this time of discharge are listed below: Please note- any labs with collected status will not have a result; if this area is blank, there are no items for review.         Medications      Medication List      CHANGE how you take these medications     carvediloL 6.25 mg tablet; Commonly known as: COREG; Dose: 6.25 mg; Take   one tablet by mouth twice daily with meals.; Quantity: 180 tablet;   Refills: 3; What changed: when to take this     CONTINUE taking these medications     acetaminophen 500 mg tablet; Commonly known as: TYLENOL EXTRA STRENGTH;   Dose: 500 mg; Refills: 0   aspirin EC 81 mg tablet; Commonly known as: ASPIR-LOW; Dose: 81 mg;   Refills: 0   atorvastatin 40 mg tablet; Commonly known as: LIPITOR; Doctor's   comments: This prescription was filled on 10/12/2023. Any refills   authorized will be placed on file.; TAKE 1 TABLET BY MOUTH EVERY NIGHT AT   BEDTIME; Quantity: 90 tablet; Refills: 3   busPIRone 15 mg tablet; Commonly known as: BUSPAR; Dose: 0.5 tablet;   Refills: 0   dorzolamide-timoloL 2-0.5 % ophthalmic solution; Commonly known as:   COSOPT; Dose: 1 drop; Refills: 0   fluticasone propionate 50 mcg/actuation nasal spray, suspension;   Commonly known as: FLONASE; Dose: 1 spray; Refills: 0   loratadine 10 mg tablet; Commonly known as: CLARITIN; Dose: 10 mg;   Refills: 0   losartan 50 mg tablet; Commonly known as: COZAAR; Dose: 50 mg; Refills:   0   pantoprazole DR 40 mg tablet; Commonly known as: PROTONIX; Dose: 1   tablet; Refills: 0   traZODone 50 mg tablet; Commonly known as: DESYREL; Dose: 1 tablet;   Refills: 0     STOP taking these medications     hydroCHLOROthiazide 12.5 mg tablet       Return Appointments and Scheduled Appointments     Scheduled appointments:      May 16, 2024 11:15 AM  Office visit with Zannie Hey, MD  Cardiovascular Medicine: [facility] (CVM Exam) [address]  [address], [address]  [place] [address]  [phone]            Consults, Procedures, Diagnostics, Micro, Pathology   Consults: Cardiology  Surgical Procedures & Dates: None  Significant Diagnostic Studies, Micro and Procedures: noted in brief hospital course  Significant Pathology: noted in brief hospital course                       Discharge Disposition, Condition   Patient Disposition: Home or Self Care [01]  Condition at Discharge: Stable    Code Status   Full Code    Patient Instructions     Activity       Activity as Tolerated   As directed      It is important to keep increasing your activity level after you leave the hospital.  Moving around can help prevent blood clots, lung infection (pneumonia) and other problems.  Gradually increasing the number of times you are up moving around will help you return to your normal activity level more quickly.  Continue to increase the number of times you are up to the chair and walking daily to return to your normal activity level. Begin to work toward your normal activity  level at discharge          Diet       Regular Diet   As directed      You have no dietary restriction. Please continue with a healthy balanced diet.             Discharge education provided to patient., Signs and Symptoms:   Report these signs and symptoms       Report These Signs and Symptoms   As directed      Please contact your doctor if you have any of the following symptoms: temperature higher than 100.4 degrees F, uncontrolled pain, persistent nausea and/or vomiting, difficulty breathing, chest pain, or severe abdominal pain        , Education: , and Others Instructions:   Other Orders       COVID-19 TESTING NOT REQUIRED   Complete by: As directed      Questions About Your Stay   Complete by: As directed      Discharging attending physician: Valma Gazella    Order comments: For questions or concerns regarding your hospital stay, call [phone].            Additional Orders: Case Management, Supplies, Home Health     Home Health/DME       None              Signed:  Vermell Goes, MD  04/11/2024      cc:  Primary Care Physician:  Esequiel Hector   Verified    Referring physicians:  No ref. provider found   Additional provider(s):        Did we miss something? If additional records are needed, please fax a request on office letterhead to [phone]. Please include the patient's name, date of birth, fax number and type of information needed. Additional request can be made by email at [email or web address]. For general questions of information about electronic records sharing, call [phone].

## 2024-04-11 NOTE — Progress Notes
 CARDIOLOGY PROGRESS NOTE      Grace Wright             Admission Date: 04/06/2024  Today's Date: 04/11/2024  LOS: 5 days    Summary of active cardiac issues:       Subjective     No acute events overnight. Patient reports no recurrence of chest pain since she has been here. Patient denies being on Entresto or Jardiance in the past but reports she cannot tolerate statins.    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 She is also RSV positive. RCA known to be CTO. Stress test shows extensive infarct but also small area of reversible ischemia.  Patient reports chest pain at rest lasting for 15-20 minutes since Easter holidays, not related to exertion but has felt fatigued overall. She doesn't have NTG at home. Last time she had chest pain in 2019 when she was found to have LAD disease which was stented.  Echo shows EF of 25%. Trop has been negative. Renal function is stable. She has had weight loss due to lack of appetite.  Risk factors: HTN, HLD, former smoker, quit after heart attack.  Coronary angiography done on 5/12 showed stable CAD, known RCA CTO, and no new significant lesions.     As patient had no new lesions to explain her symptoms on coronary angiography, patient ok to be discharged. Hasn't had any new symptoms here.  Recommend switching to Entresto from Losartan. Can increase as an outpatient.  Continue aspirin, continue GDMT with Coreg 6.25 mg BID. This could be further optimized and may help with angina symptoms, as an outpatient.  Device check shows AT-AF episodes, will see if she has any while here on tele. Longest episode 1 hr 26 min on 5/2. Continue to monitor as an outpatient.        Cardiology team will will sign off.    Staffed with Dr. Lander Pines who agrees with the plan as outlined above. Please page us  if you have any questions or concerns. After 5PM please call Cardiology fellow on call.  Monday - Friday 8AM-5PM please call Cardiology consult pager    Jackqulyn Masse, MBBS  Cardiovascular Diseases Fellow  04/11/2024  2:14 PM      Medications  Scheduled Meds:aspirin chewable tablet 81 mg, 81 mg, Oral, QDAY  atorvastatin (LIPITOR) tablet 40 mg, 40 mg, Oral, QDAY  busPIRone (BUSPAR) tablet 7.5 mg, 7.5 mg, Oral, BID  carvediloL (COREG) tablet 6.25 mg, 6.25 mg, Oral, BID  dorzolamide (TRUSOPT) 2 % ophthalmic solution 1 drop, 1 drop, Both Eyes, BID   And  timoloL maleate (TIMOPTIC) 0.5 % ophthalmic drops 1 drop, 1 drop, Both Eyes, BID  enoxaparin (LOVENOX) syringe 40 mg, 40 mg, Subcutaneous, QDAY(21)  fluticasone propionate (FLONASE) nasal spray 2 spray, 2 spray, Each Nostril, QDAY  losartan (COZAAR) tablet 50 mg, 50 mg, Oral, QDAY  pantoprazole DR (PROTONIX) tablet 40 mg, 40 mg, Oral, QDAY(21)  traZODone (DESYREL) tablet 50 mg,  50 mg, Oral, QHS    Continuous Infusions:  PRN and Respiratory Meds:acetaminophen Q4H PRN, diphenhydrAMINE HCL Q4H PRN **OR** diphenhydrAMINE HCL Q4H PRN, melatonin QHS PRN, ondansetron Q6H PRN **OR** ondansetron (ZOFRAN) IV Q6H PRN, polyethylene glycol 3350 QDAY PRN, sennosides-docusate sodium QDAY PRN                           Vital Signs: Most Recent                 Vital Signs: 24 Hour Range   BP: 145/72 (05/13 0813)  Temp: 37.4 ?C (99.4 ?F) (05/13 0813)  Pulse: 66 (05/13 0813)  Respirations: 18 PER MINUTE (05/13 0813)  SpO2: 95 % (05/13 0813)  O2 Device: None (Room air) (05/13 0813) BP: (135-176)/(56-84)   Temp:  [36.5 ?C (97.7 ?F)-37.4 ?C (99.4 ?F)]   Pulse:  [60-66]   Respirations:  [16 PER MINUTE-25 PER MINUTE]   SpO2:  [94 %-97 %]   O2 Device: None (Room air)     Vitals: 04/08/24 0624   Weight: 54.8 kg (120 lb 12.8 oz)       Intake/Output Summary (Last 24 hours) at 04/11/2024 1414  Last data filed at 04/11/2024 0900  Gross per 24 hour   Intake 750 ml   Output --   Net 750 ml           Physical Exam  Constitutional:       Appearance: Normal appearance.   HENT:      Head: Normocephalic and atraumatic.   Cardiovascular:      Rate and Rhythm: Normal rate and regular rhythm.      Heart sounds: No murmur heard.  Pulmonary:      Effort: Pulmonary effort is normal.      Breath sounds: Normal breath sounds.   Musculoskeletal:      Right lower leg: No edema.      Left lower leg: No edema.      Comments: Normal right radial pulse.   Skin:     Findings: No lesion.   Neurological:      General: No focal deficit present.      Mental Status: She is alert and oriented to person, place, and time.   Psychiatric:         Mood and Affect: Mood normal.         Behavior: Behavior normal.         Thought Content: Thought content normal.         Judgment: Judgment normal.           Lab/Radiology/Other Diagnostic Tests:    Pertinent labs, imaging, today's EKG and telemetry reviewed.    24-hour labs:    Results for orders placed or performed during the hospital encounter of 04/06/24 (from the past 24 hours)   CBC AND DIFF    Collection Time: 04/11/24  6:50 AM   Result Value Ref Range    White Blood Cells 7.00 4.50 - 11.00 10*3/uL    Red Blood Cells 3.71 (L) 4.00 - 5.00 10*6/uL    Hemoglobin 11.6 (L) 12.0 - 15.0 g/dL    Hematocrit 95.6 (L) 36.0 - 45.0 %    MCV 94.6 80.0 - 100.0 fL    MCH 31.3 26.0 - 34.0 pg    MCHC 33.1 32.0 - 36.0 g/dL    RDW 38.7 56.4 - 33.2 %    Platelet Count 187 150 - 400 10*3/uL  MPV 8.6 7.0 - 11.0 fL    Neutrophils 68.0 41.0 - 77.0 %    Lymphocytes 14.8 (L) 24.0 - 44.0 %    Monocytes 11.0 4.0 - 12.0 %    Eosinophils 5.4 (H) 0.0 - 5.0 %    Basophils 0.8 0.0 - 2.0 %    Absolute Neutrophil Count 4.80 1.80 - 7.00 10*3/uL    Absolute Lymph Count 1.00 1.00 - 4.80 10*3/uL    Absolute Monocyte Count 0.80 0.00 - 0.80 10*3/uL    Absolute Eosinophil Count 0.40 0.00 - 0.45 10*3/uL    Absolute Basophil Count 0.10 0.00 - 0.20 10*3/uL   BASIC METABOLIC PANEL    Collection Time: 04/11/24  8:47 AM   Result Value Ref Range    Sodium 139 137 - 147 mmol/L    Potassium 3.9 3.5 - 5.1 mmol/L    Chloride 107 98 - 110 mmol/L    Glucose 113 (H) 70 - 100 mg/dL    Blood Urea Nitrogen 14 7 - 25 mg/dL    Creatinine 9.56 2.13 - 1.00 mg/dL    Calcium 8.8 8.5 - 08.6 mg/dL    CO2 22 21 - 30 mmol/L    Anion Gap 10 3 - 12    Glomerular Filtration Rate (GFR) >60 >60 mL/min     Glucose: (!) 113 (04/11/24 0847)

## 2024-04-17 ENCOUNTER — Encounter: Admit: 2024-04-17 | Discharge: 2024-04-17 | Payer: MEDICARE

## 2024-04-17 DIAGNOSIS — Z95810 Presence of automatic (implantable) cardiac defibrillator: Secondary | ICD-10-CM

## 2024-04-18 ENCOUNTER — Encounter: Admit: 2024-04-18 | Discharge: 2024-04-18 | Payer: MEDICARE

## 2024-05-16 ENCOUNTER — Encounter: Admit: 2024-05-16 | Discharge: 2024-05-16 | Payer: MEDICARE

## 2024-05-16 DIAGNOSIS — R079 Chest pain, unspecified: Secondary | ICD-10-CM

## 2024-05-16 DIAGNOSIS — I2510 Atherosclerotic heart disease of native coronary artery without angina pectoris: Secondary | ICD-10-CM

## 2024-05-16 DIAGNOSIS — I5022 Chronic systolic (congestive) heart failure: Secondary | ICD-10-CM

## 2024-05-16 DIAGNOSIS — Z136 Encounter for screening for cardiovascular disorders: Secondary | ICD-10-CM

## 2024-05-16 DIAGNOSIS — E782 Mixed hyperlipidemia: Secondary | ICD-10-CM

## 2024-05-16 DIAGNOSIS — I255 Ischemic cardiomyopathy: Secondary | ICD-10-CM

## 2024-05-16 DIAGNOSIS — I5189 Other ill-defined heart diseases: Secondary | ICD-10-CM

## 2024-05-16 DIAGNOSIS — I10 Essential (primary) hypertension: Secondary | ICD-10-CM

## 2024-05-16 DIAGNOSIS — I071 Rheumatic tricuspid insufficiency: Secondary | ICD-10-CM

## 2024-05-16 MED ORDER — ATROVENT HFA 17 MCG/ACTUATION IN HFAA
2 | RESPIRATORY_TRACT | 3 refills | 30.00000 days | Status: AC | PRN
Start: 2024-05-16 — End: ?

## 2024-05-16 MED ORDER — ATORVASTATIN 40 MG PO TAB
40 mg | Freq: Every evening | ORAL | 0 refills | 90.00000 days | Status: AC
Start: 2024-05-16 — End: ?

## 2024-05-16 MED ORDER — CARVEDILOL 25 MG PO TAB
25 mg | Freq: Two times a day (BID) | ORAL | 0 refills | 90.00000 days | Status: AC
Start: 2024-05-16 — End: ?

## 2024-05-16 NOTE — Assessment & Plan Note
 She was confused by the discharge instructions and hadn't been taking her atorvastatin--I resumed this today.

## 2024-05-16 NOTE — Progress Notes
 Date of Service: 05/16/2024    Grace Wright is a 88 y.o. female.       HPI     Grace Wright was in the [place] clinic today for follow-up regarding coronary disease, ischemic cardiomyopathy, and ICD.  She was hospitalized a month ago for an exacerbation of heart failure and chest pain.  Coronary arteriography didn't show any new, significant obstructive disease.  She was diuresed and gradually felt better.  She denies any trouble lately with chest discomfort or palpitations.  She knows that if she gets in too much of a hurry she'll have to stop to catch her breath.  She's adjusted to this, she says.  The only thing bothering her now is a persistent dry cough.  She's tried using generic Claritin, but it hasn't helped.      She has had absolutely no problems with palpitations or other symptoms.  She denies any chest discomfort or breathlessness.  She has had no peripheral edema, orthopnea, or other manifestations of fluid retention.            Vitals:    05/16/24 1119 05/16/24 1132   BP: (!) 161/90 (!) 160/70   BP Source: Arm, Left Upper Arm, Left Upper   Pulse: 69    SpO2: 97%    O2 Device: None (Room air)    PainSc: Zero    Weight: 53.1 kg (117 lb)    Height: 152.4 cm (5')      Body mass index is 22.85 kg/m?SABRA     Past Medical History  Patient Active Problem List    Diagnosis Date Noted    Preop cardiovascular exam 03/24/2018    Carotid artery bruit 01/14/2017     02/09/17 Carotid duplex at [facility]: Mild carotid bulb plaque bilaterally extending of the prox bilateral internal carotid arteries. No hemodynamically significant stenosis.      Automatic implantable cardioverter-defibrillator in situ 03/25/2010    Coronary artery disease 09/03/2009     5/86 - Anterior MI.      1987 - RCA and LAD angioplasty.      7/99 - Exercise echo: EF 30%, non-ischemic.      7/00 - Gated bruce thallium: no active inducible ischemia.      7/01 - Exercise echo: non-ischemic.  01/07/2018 cath - DES x 1 to LAD       Chronic systolic heart failure (CMS-HCC) 09/03/2009      7/99 - EF 30% per echo.       7/00 - EF 37% per thallium.       7/01 - EF 25% per echo      Hyperlipidemia 09/03/2009     1997- Treatment with Zocor.       7/01 - Switched to Lipitor d/t prescription formulary.       2010 - Switched to Crestor for prescription formulary      Hypertension 09/03/2009    Mitral regurgitation 09/03/2009    Tricuspid regurgitation 09/03/2009    Diastolic dysfunction 09/03/2009     Mild           Review of Systems   Constitutional: Negative.   HENT: Negative.     Eyes: Negative.    Cardiovascular: Negative.    Respiratory:  Positive for cough.    Endocrine: Negative.    Hematologic/Lymphatic: Negative.    Skin: Negative.    Musculoskeletal: Negative.    Gastrointestinal: Negative.    Genitourinary: Negative.    Neurological:  Positive for light-headedness.  Psychiatric/Behavioral: Negative.     Allergic/Immunologic: Negative.        Physical Exam    Physical Exam   General Appearance: no distress   Skin: warm, no ulcers or xanthomas   Digits and Nails: no cyanosis or clubbing   Eyes: conjunctivae and lids normal, pupils are equal and round   Teeth/Gums/Palate: dentition unremarkable, no lesions   Lips & Oral Mucosa: no pallor or cyanosis   Neck Veins: normal JVP , neck veins are not distended   Thyroid: no nodules, masses, tenderness or enlargement   Chest Inspection: chest is normal in appearance   Respiratory Effort: breathing comfortably, no respiratory distress   Auscultation/Percussion: lungs clear to auscultation, no rales or rhonchi, no wheezing   PMI: PMI not enlarged or displaced   Cardiac Rhythm: regular rhythm and normal rate   Cardiac Auscultation: S1, S2 normal, no rub, no gallop   Murmurs: no murmur   Peripheral Circulation: normal peripheral circulation   Carotid Arteries: normal carotid upstroke bilaterally, no bruits   Radial Arteries: normal symmetric radial pulses   Abdominal Aorta: no abdominal aortic bruit   Pedal Pulses: normal symmetric pedal pulses   Lower Extremity Edema: no lower extremity edema   Abdominal Exam: soft, non-tender, no masses, bowel sounds normal   Liver & Spleen: no organomegaly   Gait & Station: walks without assistance   Muscle Strength: normal muscle tone   Orientation: oriented to time, place and person   Affect & Mood: appropriate and sustained affect   Language and Memory: patient responsive and seems to comprehend information   Neurologic Exam: neurological assessment grossly intact   Other: moves all extremities      Cardiovascular Health Factors  Vitals BP Readings from Last 3 Encounters:   05/16/24 (!) 160/70   04/11/24 (!) 145/72   04/05/24 104/65     Wt Readings from Last 3 Encounters:   05/16/24 53.1 kg (117 lb)   04/08/24 54.8 kg (120 lb 12.8 oz)   04/05/24 53.1 kg (117 lb)     BMI Readings from Last 3 Encounters:   05/16/24 22.85 kg/m?   04/08/24 23.59 kg/m?   04/05/24 21.40 kg/m?      Smoking Social History     Tobacco Use   Smoking Status Former    Current packs/day: 0.00    Average packs/day: 0.5 packs/day for 20.0 years (10.0 ttl pk-yrs)    Types: Cigarettes    Start date: 11/30/1964    Quit date: 11/30/1984    Years since quitting: 39.4   Smokeless Tobacco Never      Lipid Profile Cholesterol   Date Value Ref Range Status   12/10/2021 172  Final     HDL   Date Value Ref Range Status   12/10/2021 34 (L) >=40 Final     LDL   Date Value Ref Range Status   12/10/2021 89  Final     Triglycerides   Date Value Ref Range Status   12/10/2021 245 (H) <150 Final      Blood Sugar Hemoglobin A1C   Date Value Ref Range Status   12/25/2016 5.8  Final     Glucose   Date Value Ref Range Status   04/11/2024 113 (H) 70 - 100 mg/dL Final   94/88/7974 98 70 - 100 mg/dL Final   94/89/7974 895 (H) 70 - 100 mg/dL Final          Problems Addressed Today  Encounter Diagnoses   Name Primary?  Chronic systolic heart failure (CMS-HCC) Yes    Screening for heart disease     Tricuspid valve insufficiency, unspecified etiology Ischemic cardiomyopathy     Primary hypertension     Diastolic dysfunction     Coronary artery disease involving native coronary artery of native heart without angina pectoris     Chest pain, unspecified type     Coronary artery disease involving native coronary artery of native heart     Mixed hyperlipidemia        Assessment and Plan       Chronic systolic heart failure (CMS-HCC)  I am increasing her carvedilol to 25 mg BID--her home BP is too high.  I didn't make any changes in diuretic--she doesn't appear volume overloaded clinically.    I recommended that she try Mucinex and gave her a prescription for an Alupent inhaler.      Hyperlipidemia  She was confused by the discharge instructions and hadn't been taking her atorvastatin--I resumed this today.      Current Medications (including today's revisions)   acetaminophen (TYLENOL EXTRA STRENGTH) 500 mg tablet Take one tablet by mouth every 6 hours as needed for Pain.    aspirin EC 81 mg tablet Take one tablet by mouth daily.    atorvastatin (LIPITOR) 40 mg tablet Take one tablet by mouth at bedtime daily.    busPIRone (BUSPAR) 15 mg tablet Take one-half tablet by mouth twice daily.    carvediloL (COREG) 25 mg tablet Take one tablet by mouth twice daily with meals. Take with food.    dorzolamide-timoloL (COSOPT) 2-0.5 % ophthalmic solution Apply one drop to both eyes twice daily.    fluticasone propionate (FLONASE) 50 mcg/actuation nasal spray, suspension Apply one spray to each nostril as directed twice daily.    ipratropium bromide (ATROVENT HFA) 17 mcg/actuation inhaler Inhale two puffs by mouth into the lungs every 6 hours as needed.    losartan (COZAAR) 50 mg tablet Take one tablet by mouth daily.    pantoprazole DR (PROTONIX) 40 mg tablet Take one tablet by mouth twice daily.    traZODone (DESYREL) 50 mg tablet Take one tablet by mouth at bedtime as needed for Sleep.     Total time spent on today's office visit was 45 minutes.  This includes face-to-face in person visit with patient as well as nonface-to-face time including review of the EMR, outside records, labs, radiologic studies, echocardiogram & other cardiovascular studies, formation of treatment plan, after visit summary, future disposition, and lastly on documentation.

## 2024-05-16 NOTE — Assessment & Plan Note
 I am increasing her carvedilol to 25 mg BID--her home BP is too high.  I didn't make any changes in diuretic--she doesn't appear volume overloaded clinically.    I recommended that she try Mucinex and gave her a prescription for an Alupent inhaler.

## 2024-05-18 ENCOUNTER — Encounter: Admit: 2024-05-18 | Discharge: 2024-05-18 | Payer: MEDICARE

## 2024-05-18 NOTE — Telephone Encounter
 Patient left vm on nursing line to notify she is not going to start inhailer prescribed by Dr. Quin at last office visit due to cost.

## 2024-06-13 ENCOUNTER — Encounter: Admit: 2024-06-13 | Discharge: 2024-06-13 | Payer: MEDICARE

## 2024-08-15 ENCOUNTER — Encounter: Admit: 2024-08-15 | Discharge: 2024-08-15 | Payer: MEDICARE

## 2024-08-16 ENCOUNTER — Encounter: Admit: 2024-08-16 | Discharge: 2024-08-16 | Payer: MEDICARE

## 2024-08-16 NOTE — Telephone Encounter
 Called lmom requested call back. To review symptoms.

## 2024-08-16 NOTE — Telephone Encounter
-----   Message from [place] S sent at 08/15/2024  4:33 PM CDT -----  Regarding: FW: Fluid overload high above alert near max levels since July.    ----- Message -----  From: Junior Sham, RN  Sent: 08/15/2024   4:26 PM CDT  To: Cvm Nurse Gen Card Team Red  Subject: FW: Fluid overload high above alert near max#      ----- Message -----  From: Con Nest, RN  Sent: 08/15/2024   4:19 PM CDT  To: Cvm Nurse Gen Card Team [place]  Subject: Fluid overload high above alert near max lev#    Fluid overload high above alert near max levels since July.    Please review with pt and provider to resolve.   TY,   Jen

## 2024-08-29 ENCOUNTER — Encounter: Admit: 2024-08-29 | Discharge: 2024-08-29 | Payer: MEDICARE

## 2024-09-04 NOTE — Assessment & Plan Note
 1986 - anterior MI.  RCA and LAD POBA.  2019 - DES to LAD    Cath 03/2024 showed chronic, subtotal RCA with collateral filling of distal vessel.  No significant obstructive lesions in left coronary.

## 2024-09-04 NOTE — Assessment & Plan Note
 Lab Results   Component Value Date    CHOL 109 08/17/2024    TRIG 165 (H) 08/17/2024    HDL 26 (L) 08/17/2024    LDL 50 08/17/2024    VLDL 49 (H) 12/10/2021    NONHDLCHOL 83 08/17/2024    CHOLHDLC 4.2 08/17/2024      Atorva 40/d.

## 2024-09-04 NOTE — Assessment & Plan Note
 Remote check 08/05/24 showed normal device function, no therapies.  90% atrial-pacing, rare RV pacing.

## 2024-09-05 ENCOUNTER — Encounter: Admit: 2024-09-05 | Discharge: 2024-09-05 | Payer: MEDICARE

## 2024-09-05 ENCOUNTER — Ambulatory Visit: Admit: 2024-09-05 | Discharge: 2024-09-05 | Payer: MEDICARE

## 2024-09-05 VITALS — BP 122/74 | HR 62 | Ht 60.0 in | Wt 110.0 lb

## 2024-09-05 DIAGNOSIS — Z95810 Presence of automatic (implantable) cardiac defibrillator: Secondary | ICD-10-CM

## 2024-09-05 DIAGNOSIS — I5022 Chronic systolic (congestive) heart failure: Principal | ICD-10-CM

## 2024-09-05 DIAGNOSIS — E782 Mixed hyperlipidemia: Secondary | ICD-10-CM

## 2024-09-05 DIAGNOSIS — I2510 Atherosclerotic heart disease of native coronary artery without angina pectoris: Secondary | ICD-10-CM

## 2024-09-05 DIAGNOSIS — I10 Essential (primary) hypertension: Secondary | ICD-10-CM

## 2024-09-05 MED ORDER — FUROSEMIDE 40 MG PO TAB
20 mg | Freq: Every morning | ORAL | 0 refills | 90.00000 days | Status: AC
Start: 2024-09-05 — End: ?

## 2024-09-05 NOTE — Progress Notes
 Date of Service: 09/05/2024    Grace Wright is a 88 y.o. female.       HPI     Grace Wright was in the [facility] today for follow-up regarding coronary disease, HFrEF, and ICD.  She was hospitalized at [facility] a month ago for an exacerbation of heart failure.      In retrospect, she now recognizes that she'd been increasingly dyspneic for a few days prior to having acute pulmonary edema.  She responded well to IV furosemide  and was discharged on oral furosemide  and spironolactone.  Over the next two weeks, however, she started to develop postural light headedness and when she saw Dr. Darland a week ago her systolic BP was in the 90s.  He DCed both diuretics at that point and her BP has come back up to around 120/70 and she feels better.    She had a similar presentation back in May, 2025, and was hospitalized at [facility] for several days.  Coronary arteriography didn't show any new, significant obstructive disease.  She was diuresed and gradually felt better.      She denies any trouble lately with chest discomfort or palpitations.        She has had absolutely no problems with palpitations or other symptoms.  She denies any chest discomfort or breathlessness.  She has had no peripheral edema, orthopnea, or other manifestations of fluid retention.         Vitals:    09/05/24 0926   BP: 122/74   BP Source: Arm, Left Upper   Pulse: 62   SpO2: 96%   O2 Device: None (Room air)   PainSc: Zero   Weight: 49.9 kg (110 lb)   Height: 152.4 cm (5')     Body mass index is 21.48 kg/m?SABRA     Past Medical History  Patient Active Problem List    Diagnosis Date Noted    Preop cardiovascular exam 03/24/2018    Carotid artery bruit 01/14/2017     02/09/17 Carotid duplex at [facility]: Mild carotid bulb plaque bilaterally extending of the prox bilateral internal carotid arteries. No hemodynamically significant stenosis.      Automatic implantable cardioverter-defibrillator in situ 03/25/2010    Coronary artery disease 09/03/2009 7/86 - Anterior MI.      1987 - RCA and LAD angioplasty.      7/99 - Exercise echo: EF 30%, non-ischemic.      7/00 - Gated bruce thallium: no active inducible ischemia.      7/01 - Exercise echo: non-ischemic.  01/07/2018 cath - DES x 1 to LAD       Chronic systolic heart failure (CMS-HCC) 09/03/2009      7/99 - EF 30% per echo.       7/00 - EF 37% per thallium.       7/01 - EF 25% per echo      Hyperlipidemia 09/03/2009     1997- Treatment with Zocor.       7/01 - Switched to Lipitor d/t prescription formulary.       2010 - Switched to Crestor for prescription formulary      Hypertension 09/03/2009    Mitral regurgitation 09/03/2009    Tricuspid regurgitation 09/03/2009    Diastolic dysfunction 09/03/2009     Mild           Review of Systems   Constitutional: Negative.   HENT: Negative.     Eyes: Negative.    Cardiovascular: Negative.  Respiratory: Negative.     Endocrine: Negative.    Hematologic/Lymphatic: Negative.    Skin: Negative.    Musculoskeletal: Negative.    Gastrointestinal: Negative.    Genitourinary: Negative.    Neurological: Negative.    Psychiatric/Behavioral: Negative.     Allergic/Immunologic: Negative.        Physical Exam    Physical Exam   General Appearance: no distress   Skin: warm, no ulcers or xanthomas   Digits and Nails: no cyanosis or clubbing   Eyes: conjunctivae and lids normal, pupils are equal and round   Teeth/Gums/Palate: dentition unremarkable, no lesions   Lips & Oral Mucosa: no pallor or cyanosis   Neck Veins: normal JVP , neck veins are not distended   Thyroid: no nodules, masses, tenderness or enlargement   Chest Inspection: chest is normal in appearance   Respiratory Effort: breathing comfortably, no respiratory distress   Auscultation/Percussion: lungs clear to auscultation, no rales or rhonchi, no wheezing   PMI: PMI not enlarged or displaced   Cardiac Rhythm: regular rhythm and normal rate   Cardiac Auscultation: S1, S2 normal, no rub, no gallop   Murmurs: no murmur Peripheral Circulation: normal peripheral circulation   Carotid Arteries: normal carotid upstroke bilaterally, no bruits   Radial Arteries: normal symmetric radial pulses   Abdominal Aorta: no abdominal aortic bruit   Pedal Pulses: normal symmetric pedal pulses   Lower Extremity Edema: no lower extremity edema   Abdominal Exam: soft, non-tender, no masses, bowel sounds normal   Liver & Spleen: no organomegaly   Gait & Station: walks without assistance   Muscle Strength: normal muscle tone   Orientation: oriented to time, place and person   Affect & Mood: appropriate and sustained affect   Language and Memory: patient responsive and seems to comprehend information   Neurologic Exam: neurological assessment grossly intact   Other: moves all extremities      Cardiovascular Health Factors  Vitals BP Readings from Last 3 Encounters:   09/05/24 122/74   05/16/24 (!) 160/70   04/11/24 (!) 145/72     Wt Readings from Last 3 Encounters:   09/05/24 49.9 kg (110 lb)   05/16/24 53.1 kg (117 lb)   04/08/24 54.8 kg (120 lb 12.8 oz)     BMI Readings from Last 3 Encounters:   09/05/24 21.48 kg/m?   05/16/24 22.85 kg/m?   04/08/24 23.59 kg/m?      Smoking Tobacco Use History[1]   Lipid Profile Cholesterol   Date Value Ref Range Status   08/17/2024 109  Final     HDL   Date Value Ref Range Status   08/17/2024 26 (L) >40 Final     LDL   Date Value Ref Range Status   08/17/2024 50  Final     Triglycerides   Date Value Ref Range Status   08/17/2024 165 (H) <150 Final      Blood Sugar Hemoglobin A1C   Date Value Ref Range Status   12/25/2016 5.8  Final     Glucose   Date Value Ref Range Status   04/11/2024 113 (H) 70 - 100 mg/dL Final   94/88/7974 98 70 - 100 mg/dL Final   94/89/7974 895 (H) 70 - 100 mg/dL Final     Glucose Fasting   Date Value Ref Range Status   08/18/2024 114 (H) 70 - 100 Final          Problems Addressed Today  Encounter Diagnoses   Name Primary?  Chronic systolic heart failure (CMS-HCC) Yes    Coronary artery disease involving native coronary artery of native heart without angina pectoris     Mixed hyperlipidemia     Primary hypertension     Automatic implantable cardioverter-defibrillator in situ        Assessment and Plan       Chronic systolic heart failure (CMS-HCC)  Losartan  25/d, carvedilol  25 BID.  Echo 03/2024 showed EF 23% with antero-apical akinesis.    2-day hospitalization at [facility] in mid-September.  DC on furosemide  40/day and spironolactone 25/d, but these were held starting a week ago (Dr. Darland) due to low BP.    Given her second hospitalization in 4 months for HF exacerbation I really think she'll need a modest dose of diuretic to prevent ongoing fluid retention.  I really think the spironolactone was most likely to have dropped her BP.  I started her back on furosemide  20 mg/day today.  She'll continue to monitor her BP at home and call us  if her systolic is dropping down to 105 or lower, or if she starts to feel light headed.  We'll check another BMP in about one month and start potassium chloride  as needed.    Coronary artery disease  1986 - anterior MI.  RCA and LAD POBA.  2019 - DES to LAD    Cath 03/2024 showed chronic, subtotal RCA with collateral filling of distal vessel.  No significant obstructive lesions in left coronary.    Hyperlipidemia  Lab Results   Component Value Date    CHOL 109 08/17/2024    TRIG 165 (H) 08/17/2024    HDL 26 (L) 08/17/2024    LDL 50 08/17/2024    VLDL 49 (H) 12/10/2021    NONHDLCHOL 83 08/17/2024    CHOLHDLC 4.2 08/17/2024      Atorva 40/d.    Hypertension  Losartan  25/d, carvedilol  25 BID.    Automatic implantable cardioverter-defibrillator in situ  Remote check 08/05/24 showed normal device function, no therapies.  90% atrial-pacing, rare RV pacing.    Current Medications (including today's revisions)   acetaminophen  (TYLENOL  EXTRA STRENGTH) 500 mg tablet Take one tablet by mouth every 6 hours as needed for Pain.    aspirin  EC 81 mg tablet Take one tablet by mouth daily.    atorvastatin  (LIPITOR) 40 mg tablet Take one tablet by mouth at bedtime daily.    busPIRone  (BUSPAR ) 15 mg tablet Take one-half tablet by mouth twice daily.    carvediloL  (COREG ) 25 mg tablet Take one tablet by mouth twice daily with meals. Take with food.    cephalexin  (KEFLEX ) 250 mg capsule Take one capsule by mouth twice daily.    dorzolamide -timoloL  (COSOPT) 2-0.5 % ophthalmic solution Apply one drop to both eyes daily.    fluticasone  propionate (FLONASE ) 50 mcg/actuation nasal spray, suspension Apply one spray to each nostril as directed as Needed.    furosemide  (LASIX ) 40 mg tablet Take one-half tablet by mouth every morning.    loratadine (CLARITIN) 10 mg tablet Take one tablet by mouth daily.    losartan  (COZAAR ) 25 mg tablet Take one tablet by mouth daily.    pantoprazole  DR (PROTONIX ) 40 mg tablet Take one tablet by mouth twice daily.    traZODone  (DESYREL ) 50 mg tablet Take one tablet by mouth at bedtime as needed for Sleep.     Total time spent on today's office visit was 45 minutes.  This includes face-to-face in person visit with patient as well as  nonface-to-face time including review of the EMR, outside records, labs, radiologic studies, echocardiogram & other cardiovascular studies, formation of treatment plan, after visit summary, future disposition, and lastly on documentation.              [1]   Social History  Tobacco Use   Smoking Status Former    Current packs/day: 0.00    Average packs/day: 0.5 packs/day for 20.0 years (10.0 ttl pk-yrs)    Types: Cigarettes    Start date: 11/30/1964    Quit date: 11/30/1984    Years since quitting: 39.7   Smokeless Tobacco Never

## 2024-09-14 ENCOUNTER — Encounter: Admit: 2024-09-14 | Discharge: 2024-09-14 | Payer: MEDICARE

## 2024-09-14 MED ORDER — FUROSEMIDE 20 MG PO TAB
10 mg | Freq: Every morning | ORAL | 0 refills | 90.00000 days | Status: AC
Start: 2024-09-14 — End: ?

## 2024-09-14 NOTE — Telephone Encounter
 Patient called stating she is taking 5mg  of lasix  daily half of a 10mg .  Could not find 10mg  tablet listed.  Patient states she is doing better. Bp in am 120's/60-70s in the morning 92/54 p67 in afternoon.  Denies edema, sob or weight gain.  She just realized that she was still taking spirolactone 25mg  daily and has stopped that.  She has lab due next month. She will call if issues.

## 2024-10-06 ENCOUNTER — Encounter: Admit: 2024-10-06 | Discharge: 2024-10-06 | Payer: MEDICARE

## 2024-10-06 DIAGNOSIS — I10 Essential (primary) hypertension: Secondary | ICD-10-CM

## 2024-10-06 DIAGNOSIS — E782 Mixed hyperlipidemia: Secondary | ICD-10-CM

## 2024-10-06 DIAGNOSIS — I2510 Atherosclerotic heart disease of native coronary artery without angina pectoris: Secondary | ICD-10-CM

## 2024-10-06 DIAGNOSIS — I5022 Chronic systolic (congestive) heart failure: Principal | ICD-10-CM

## 2024-10-06 DIAGNOSIS — Z95810 Presence of automatic (implantable) cardiac defibrillator: Secondary | ICD-10-CM

## 2024-10-06 LAB — BASIC METABOLIC PANEL
ANION GAP: 11
GFR ESTIMATED: 59
SODIUM: 143

## 2024-11-09 ENCOUNTER — Encounter: Admit: 2024-11-09 | Discharge: 2024-11-09 | Payer: MEDICARE

## 2024-11-09 NOTE — Telephone Encounter [36]
-----   Message from Zelda HERO sent at 11/09/2024  2:59 PM CST -----  Regarding: FW: Scheduled remote    ----- Message -----  From: Enos Quale, RN  Sent: 11/09/2024   2:49 PM CST  To: [facility] Nurse Gen Card Team [facility]  Subject: Scheduled remote                                 Hi,     We received Pt scheduled remote on 11/04/24 and FYI there is one inappropriate episode of VT therapy on 09/30/24. The device detected an episode of VT and delivered 4 rounds of ATP but the EGMs are consistent with Atrial Tachycardia with no true VT. It did successfully terminate the atrial tachycardia episode. Device is programmed to begin VT therapy at 120 bpm and it delivered the ATP because of a 70% wavelet match. She may need her zones adjusted? As well as an update to her wavelet.     Thank you,  Lonell, RN Device Team

## 2024-11-09 NOTE — Telephone Encounter [36]
 Called and spoke to patient regarding symptoms. She states that she is doing very well. She is completely asymptomatic at this point. She states that she is doing all her normal daily activities without issue. She denies palpitations, chest pain, shortness of breath, dizziness, fatigue, etc. She states that her blood pressures at home have been running 130s/70s.       Will route to [facility] for review and recommendations.

## 2024-12-01 ENCOUNTER — Encounter: Admit: 2024-12-01 | Discharge: 2024-12-01 | Payer: MEDICARE

## 2024-12-07 ENCOUNTER — Encounter: Admit: 2024-12-07 | Discharge: 2024-12-07 | Payer: MEDICARE

## 2024-12-07 VITALS — BP 118/66 | HR 78 | Ht 60.0 in | Wt 112.0 lb

## 2024-12-07 DIAGNOSIS — I2510 Atherosclerotic heart disease of native coronary artery without angina pectoris: Secondary | ICD-10-CM

## 2024-12-07 DIAGNOSIS — I5022 Chronic systolic (congestive) heart failure: Principal | ICD-10-CM

## 2024-12-07 DIAGNOSIS — E782 Mixed hyperlipidemia: Secondary | ICD-10-CM

## 2024-12-07 MED ORDER — FUROSEMIDE 20 MG PO TAB
20 mg | Freq: Every morning | ORAL | 0 refills | 90.00000 days | Status: AC
Start: 2024-12-07 — End: ?

## 2024-12-07 MED ORDER — LOSARTAN 25 MG PO TAB
25 mg | Freq: Every day | ORAL | 0 refills | 90.00000 days | Status: AC
Start: 2024-12-07 — End: ?

## 2024-12-07 NOTE — Assessment & Plan Note [38]
 Last seen 09/05/24 after [facility] hospitalization for HF exacerbation.  This had been her second HF hospitalization in 4 months.  She was hypotensive on furosemide  40/d & spiro 25/d.  I held the spiro and reduce furosemide  to 20/d.    Losartan  25/d, carvedilol  25 BID, furosemide  20/d.  Echo 03/2024 showed EF 23% with antero-apical akinesis.

## 2024-12-07 NOTE — Progress Notes [1]
 Date of Service: 12/07/2024    Grace Wright is a 89 y.o. female.       HPI     Grace Wright was in the [facility] today for follow-up regarding coronary disease, HFrEF, and ICD.  The purpose of this visit was to assess her current diuretic, anti-hypertensive medications.  When I last saw her in early October she'd been recently hospitalized at [facility] for an exacerbation of heart failure.  She had been discharged on a pretty aggressive diuretic program that had her BP down into the 80s and she was very light headed.    I stopped the spironolactone and reduced furosemide  from 40 to 20/d.  Dr. Darland had reduced her losartan  from 50 to 25/d.  He'd temporarily held her diuretics a few days before I saw Grace Wright in October.    I happy to see that she's feeling much better with BP in the 120/70 range.  She hasn't gained fluid and her breathing seems to be fine.     She denies any trouble lately with chest discomfort or palpitations.           Objective   Vitals:    12/07/24 0923   BP: 118/66   BP Source: Arm, Right Upper   Pulse: 78   SpO2: 95%   O2 Device: None (Room air)   PainSc: Zero   Weight: 50.8 kg (112 lb)   Height: 152.4 cm (5')     Body mass index is 21.87 kg/m?Grace Wright     Past Medical History  Patient Active Problem List    Diagnosis Date Noted    Preop cardiovascular exam 03/24/2018    Carotid artery bruit 01/14/2017     02/09/17 Carotid duplex at [facility]: Mild carotid bulb plaque bilaterally extending of the prox bilateral internal carotid arteries. No hemodynamically significant stenosis.      Automatic implantable cardioverter-defibrillator in situ 03/25/2010    Coronary artery disease 09/03/2009     [age] - Anterior MI.      1987 - RCA and LAD angioplasty.      7/99 - Exercise echo: EF 30%, non-ischemic.      7/00 - Gated bruce thallium: no active inducible ischemia.      7/01 - Exercise echo: non-ischemic.  01/07/2018 cath - DES x 1 to LAD       Chronic systolic heart failure (CMS-HCC) 09/03/2009      7/99 - EF 30% per echo.       7/00 - EF 37% per thallium.       7/01 - EF 25% per echo      Hyperlipidemia 09/03/2009     1997- Treatment with Zocor.       7/01 - Switched to Lipitor d/t prescription formulary.       2010 - Switched to Crestor for prescription formulary      Hypertension 09/03/2009    Mitral regurgitation 09/03/2009    Tricuspid regurgitation 09/03/2009    Diastolic dysfunction 09/03/2009     Mild         Review of Systems   Constitutional: Negative.   HENT: Negative.     Eyes: Negative.    Cardiovascular:  Positive for chest pain (right sided, lasts about 5 minutes, more pressure than pain).   Respiratory: Negative.     Endocrine: Negative.    Hematologic/Lymphatic: Negative.    Skin: Negative.    Musculoskeletal: Negative.    Gastrointestinal: Negative.    Genitourinary: Negative.  Neurological: Negative.    Psychiatric/Behavioral: Negative.     Allergic/Immunologic: Negative.        Physical Exam    Physical Exam   General Appearance: no distress   Skin: warm, no ulcers or xanthomas   Digits and Nails: no cyanosis or clubbing   Eyes: conjunctivae and lids normal, pupils are equal and round   Teeth/Gums/Palate: dentition unremarkable, no lesions   Lips & Oral Mucosa: no pallor or cyanosis   Neck Veins: normal JVP , neck veins are not distended   Thyroid: no nodules, masses, tenderness or enlargement   Chest Inspection: chest is normal in appearance   Respiratory Effort: breathing comfortably, no respiratory distress   Auscultation/Percussion: lungs clear to auscultation, no rales or rhonchi, no wheezing   PMI: PMI not enlarged or displaced   Cardiac Rhythm: regular rhythm and normal rate   Cardiac Auscultation: S1, S2 normal, no rub, no gallop   Murmurs: no murmur   Peripheral Circulation: normal peripheral circulation   Carotid Arteries: normal carotid upstroke bilaterally, no bruits   Radial Arteries: normal symmetric radial pulses   Abdominal Aorta: no abdominal aortic bruit   Pedal Pulses: normal symmetric pedal pulses   Lower Extremity Edema: no lower extremity edema   Abdominal Exam: soft, non-tender, no masses, bowel sounds normal   Liver & Spleen: no organomegaly   Gait & Station: walks without assistance   Muscle Strength: normal muscle tone   Orientation: oriented to time, place and person   Affect & Mood: appropriate and sustained affect   Language and Memory: patient responsive and seems to comprehend information   Neurologic Exam: neurological assessment grossly intact   Other: moves all extremities      Cardiovascular Health Factors  Vitals BP Readings from Last 3 Encounters:   12/07/24 118/66   09/05/24 122/74   05/16/24 (!) 160/70     Wt Readings from Last 3 Encounters:   12/07/24 50.8 kg (112 lb)   09/05/24 49.9 kg (110 lb)   05/16/24 53.1 kg (117 lb)     BMI Readings from Last 3 Encounters:   12/07/24 21.87 kg/m?   09/05/24 21.48 kg/m?   05/16/24 22.85 kg/m?      Smoking Tobacco Use History[1]   Lipid Profile Cholesterol   Date Value Ref Range Status   08/17/2024 109  Final     HDL   Date Value Ref Range Status   08/17/2024 26 (L) >40 Final     LDL   Date Value Ref Range Status   08/17/2024 50  Final     Triglycerides   Date Value Ref Range Status   08/17/2024 165 (H) <150 Final      Blood Sugar Hemoglobin A1C   Date Value Ref Range Status   12/25/2016 5.8  Final     Glucose   Date Value Ref Range Status   10/06/2024 89  Final   04/11/2024 113 (H) 70 - 100 mg/dL Final   94/88/7974 98 70 - 100 mg/dL Final     Glucose Fasting   Date Value Ref Range Status   08/18/2024 114 (H) 70 - 100 Final         Problems Addressed Today  Encounter Diagnoses   Name Primary?    Chronic systolic heart failure (CMS-HCC) Yes    Coronary artery disease involving native coronary artery of native heart without angina pectoris     Mixed hyperlipidemia        Assessment and Plan  Chronic systolic heart failure (CMS-HCC)  Last seen 09/05/24 after [facility] hospitalization for HF exacerbation.  This had been her second HF hospitalization in 4 months.  She was hypotensive on furosemide  40/d & spiro 25/d.  I held the spiro and reduce furosemide  to 20/d.    Losartan  25/d, carvedilol  25 BID, furosemide  20/d.  Echo 03/2024 showed EF 23% with antero-apical akinesis.     Coronary artery disease  1986 - anterior MI.  RCA and LAD POBA.  2019 - DES to LAD     Cath 03/2024 showed chronic, subtotal RCA with collateral filling of distal vessel.  No significant obstructive lesions in left coronary.    Hyperlipidemia  Lab Results   Component Value Date    CHOL 109 08/17/2024    TRIG 165 (H) 08/17/2024    HDL 26 (L) 08/17/2024    LDL 50 08/17/2024    VLDL 49 (H) 12/10/2021    NONHDLCHOL 83 08/17/2024    CHOLHDLC 4.2 08/17/2024      Atorva 40/d    Current Medications (including today's revisions)   acetaminophen  (TYLENOL  EXTRA STRENGTH) 500 mg tablet Take one tablet by mouth every 6 hours as needed for Pain.    aspirin  EC 81 mg tablet Take one tablet by mouth daily.    atorvastatin  (LIPITOR) 40 mg tablet Take one tablet by mouth at bedtime daily.    busPIRone  (BUSPAR ) 15 mg tablet Take one-half tablet by mouth twice daily.    carvediloL  (COREG ) 25 mg tablet Take one tablet by mouth twice daily with meals. Take with food.    dorzolamide -timoloL  (COSOPT) 2-0.5 % ophthalmic solution Apply one drop to both eyes daily.    fluticasone  propionate (FLONASE ) 50 mcg/actuation nasal spray, suspension Apply one spray to each nostril as directed as Needed.    furosemide  (LASIX ) 20 mg tablet Take one tablet by mouth every morning.    losartan  (COZAAR ) 25 mg tablet Take one tablet by mouth daily.    pantoprazole  DR (PROTONIX ) 40 mg tablet Take one tablet by mouth twice daily.    traZODone  (DESYREL ) 50 mg tablet Take one tablet by mouth at bedtime as needed for Sleep.     Total time spent on today's office visit was 45 minutes.  This includes face-to-face in person visit with patient as well as nonface-to-face time including review of the EMR, outside records, labs, radiologic studies, echocardiogram & other cardiovascular studies, formation of treatment plan, after visit summary, future disposition, and lastly on documentation.              [1]   Social History  Tobacco Use   Smoking Status Former    Current packs/day: 0.00    Average packs/day: 0.5 packs/day for 20.0 years (10.0 ttl pk-yrs)    Types: Cigarettes    Start date: 11/30/1964    Quit date: 11/30/1984    Years since quitting: 40.0    Passive exposure: Past   Smokeless Tobacco Never

## 2024-12-07 NOTE — Assessment & Plan Note [38]
 Lab Results   Component Value Date    CHOL 109 08/17/2024    TRIG 165 (H) 08/17/2024    HDL 26 (L) 08/17/2024    LDL 50 08/17/2024    VLDL 49 (H) 12/10/2021    NONHDLCHOL 83 08/17/2024    CHOLHDLC 4.2 08/17/2024      Atorva 40/d.

## 2024-12-07 NOTE — Assessment & Plan Note [38]
 1986 - anterior MI.  RCA and LAD POBA.  2019 - DES to LAD    Cath 03/2024 showed chronic, subtotal RCA with collateral filling of distal vessel.  No significant obstructive lesions in left coronary.
